# Patient Record
Sex: Female | Born: 1950 | Race: Black or African American | Hispanic: No | Marital: Single | Smoking: Never smoker
Health system: Southern US, Community
[De-identification: ages and names within clinical notes are randomized; demographics above are authoritative.]

## PROBLEM LIST (undated history)

## (undated) DIAGNOSIS — T8859XA Other complications of anesthesia, initial encounter: Secondary | ICD-10-CM

## (undated) DIAGNOSIS — M1990 Unspecified osteoarthritis, unspecified site: Secondary | ICD-10-CM

## (undated) DIAGNOSIS — E785 Hyperlipidemia, unspecified: Secondary | ICD-10-CM

## (undated) DIAGNOSIS — E669 Obesity, unspecified: Secondary | ICD-10-CM

## (undated) DIAGNOSIS — Z8742 Personal history of other diseases of the female genital tract: Secondary | ICD-10-CM

## (undated) DIAGNOSIS — J189 Pneumonia, unspecified organism: Secondary | ICD-10-CM

## (undated) DIAGNOSIS — K279 Peptic ulcer, site unspecified, unspecified as acute or chronic, without hemorrhage or perforation: Secondary | ICD-10-CM

## (undated) DIAGNOSIS — I10 Essential (primary) hypertension: Secondary | ICD-10-CM

## (undated) DIAGNOSIS — Z9889 Other specified postprocedural states: Secondary | ICD-10-CM

## (undated) HISTORY — PX: TONSILLECTOMY: SUR1361

## (undated) HISTORY — PX: NECK SURGERY: SHX720

## (undated) HISTORY — DX: Personal history of other diseases of the female genital tract: Z87.42

## (undated) HISTORY — DX: Peptic ulcer, site unspecified, unspecified as acute or chronic, without hemorrhage or perforation: K27.9

## (undated) HISTORY — DX: Essential (primary) hypertension: I10

## (undated) HISTORY — PX: TOOTH EXTRACTION: SUR596

## (undated) HISTORY — PX: OTHER SURGICAL HISTORY: SHX169

## (undated) HISTORY — PX: ABDOMINAL HYSTERECTOMY: SHX81

## (undated) HISTORY — PX: KNEE ARTHROSCOPY: SUR90

## (undated) HISTORY — DX: Obesity, unspecified: E66.9

## (undated) HISTORY — PX: TUBAL LIGATION: SHX77

## (undated) HISTORY — PX: OOPHORECTOMY: SHX86

## (undated) HISTORY — DX: Hyperlipidemia, unspecified: E78.5

## (undated) HISTORY — PX: CARPAL TUNNEL RELEASE: SHX101

---

## 2010-09-13 ENCOUNTER — Emergency Department (HOSPITAL_BASED_OUTPATIENT_CLINIC_OR_DEPARTMENT_OTHER): Admission: EM | Admit: 2010-09-13 | Discharge: 2010-09-13 | Payer: Self-pay | Admitting: Emergency Medicine

## 2010-09-26 ENCOUNTER — Ambulatory Visit: Payer: Self-pay | Admitting: Diagnostic Radiology

## 2010-09-26 ENCOUNTER — Emergency Department (HOSPITAL_BASED_OUTPATIENT_CLINIC_OR_DEPARTMENT_OTHER): Admission: EM | Admit: 2010-09-26 | Discharge: 2010-09-26 | Payer: Self-pay | Admitting: Emergency Medicine

## 2010-11-13 ENCOUNTER — Emergency Department (HOSPITAL_BASED_OUTPATIENT_CLINIC_OR_DEPARTMENT_OTHER)
Admission: EM | Admit: 2010-11-13 | Discharge: 2010-11-13 | Disposition: A | Discharge status: Other Acute Inpatient Hospital | Payer: Self-pay | Source: Home / Self Care | Admitting: Emergency Medicine

## 2010-11-14 LAB — POCT CARDIAC MARKERS
CKMB, poc: 1.2 ng/mL (ref 1.0–8.0)
CKMB, poc: <1 ng/mL — ABNORMAL LOW (ref 1.0–8.0)
Myoglobin, poc: 88.5 ng/mL (ref 12–200)
Myoglobin, poc: 72.4 ng/mL (ref 12–200)
Troponin i, poc: <0.05 ng/mL (ref 0.00–0.09)
Troponin i, poc: <0.05 ng/mL (ref 0.00–0.09)

## 2010-11-14 LAB — BASIC METABOLIC PANEL
BUN: 18 mg/dL (ref 6–23)
CO2: 27 mEq/L (ref 19–32)
Calcium: 9.8 mg/dL (ref 8.4–10.5)
Chloride: 104 mEq/L (ref 96–112)
Creatinine, Ser: 1.2 mg/dL (ref 0.4–1.2)
GFR calc Af Amer: 55 mL/min — ABNORMAL LOW (ref >60)
GFR calc non Af Amer: 46 mL/min — ABNORMAL LOW (ref >60)
Glucose, Bld: 103 mg/dL — ABNORMAL HIGH (ref 70–99)
Potassium: 3.2 mEq/L — ABNORMAL LOW (ref 3.5–5.1)
Sodium: 144 mEq/L (ref 135–145)

## 2010-11-14 LAB — DIFFERENTIAL
Basophils Absolute: 0.1 10*3/uL (ref 0.0–0.1)
Basophils Relative: 1 % (ref 0–1)
Eosinophils Absolute: 0.1 10*3/uL (ref 0.0–0.7)
Eosinophils Relative: 2 % (ref 0–5)
Lymphocytes Relative: 36 % (ref 12–46)
Lymphs Abs: 2.3 10*3/uL (ref 0.7–4.0)
Monocytes Absolute: 0.6 10*3/uL (ref 0.1–1.0)
Monocytes Relative: 9 % (ref 3–12)
Neutro Abs: 3.4 10*3/uL (ref 1.7–7.7)
Neutrophils Relative %: 52 % (ref 43–77)

## 2010-11-14 LAB — CBC
HCT: 41.2 % (ref 36.0–46.0)
Hemoglobin: 13.8 g/dL (ref 12.0–15.0)
MCH: 28.2 pg (ref 26.0–34.0)
MCHC: 33.5 g/dL (ref 30.0–36.0)
MCV: 84.1 fL (ref 78.0–100.0)
Platelets: 256 10*3/uL (ref 150–400)
RBC: 4.9 MIL/uL (ref 3.87–5.11)
RDW: 13.4 % (ref 11.5–15.5)
WBC: 6.5 10*3/uL (ref 4.0–10.5)

## 2011-01-10 LAB — POCT CARDIAC MARKERS
CKMB, poc: <1 ng/mL — ABNORMAL LOW (ref 1.0–8.0)
Myoglobin, poc: 57.5 ng/mL (ref 12–200)
Troponin i, poc: <0.05 ng/mL (ref 0.00–0.09)

## 2011-01-10 LAB — COMPREHENSIVE METABOLIC PANEL
ALT: 24 U/L (ref 0–35)
AST: 30 U/L (ref 0–37)
Albumin: 4.4 g/dL (ref 3.5–5.2)
Alkaline Phosphatase: 133 U/L — ABNORMAL HIGH (ref 39–117)
BUN: 26 mg/dL — ABNORMAL HIGH (ref 6–23)
CO2: 29 mEq/L (ref 19–32)
Calcium: 9.5 mg/dL (ref 8.4–10.5)
Chloride: 102 mEq/L (ref 96–112)
Creatinine, Ser: 1.1 mg/dL (ref 0.4–1.2)
GFR calc Af Amer: >60 mL/min (ref >60)
GFR calc non Af Amer: 51 mL/min — ABNORMAL LOW (ref >60)
Glucose, Bld: 123 mg/dL — ABNORMAL HIGH (ref 70–99)
Potassium: 3.2 mEq/L — ABNORMAL LOW (ref 3.5–5.1)
Sodium: 144 mEq/L (ref 135–145)
Total Bilirubin: 0.5 mg/dL (ref 0.3–1.2)
Total Protein: 8.2 g/dL (ref 6.0–8.3)

## 2011-01-10 LAB — DIFFERENTIAL
Basophils Absolute: 0.1 10*3/uL (ref 0.0–0.1)
Basophils Relative: 2 % — ABNORMAL HIGH (ref 0–1)
Eosinophils Absolute: 0.3 10*3/uL (ref 0.0–0.7)
Eosinophils Relative: 4 % (ref 0–5)
Lymphocytes Relative: 43 % (ref 12–46)
Lymphs Abs: 3.5 10*3/uL (ref 0.7–4.0)
Monocytes Absolute: 0.6 10*3/uL (ref 0.1–1.0)
Monocytes Relative: 8 % (ref 3–12)
Neutro Abs: 3.6 10*3/uL (ref 1.7–7.7)
Neutrophils Relative %: 44 % (ref 43–77)

## 2011-01-10 LAB — CBC
HCT: 40.3 % (ref 36.0–46.0)
HCT: 40.3 % (ref 36.0–46.0)
Hemoglobin: 13.7 g/dL (ref 12.0–15.0)
Hemoglobin: 13.7 g/dL (ref 12.0–15.0)
MCH: 29.2 pg (ref 26.0–34.0)
MCH: 29.2 pg (ref 26.0–34.0)
MCHC: 34 g/dL (ref 30.0–36.0)
MCHC: 34 g/dL (ref 30.0–36.0)
MCV: 86 fL (ref 78.0–100.0)
MCV: 86 fL (ref 78.0–100.0)
Platelets: 238 10*3/uL (ref 150–400)
Platelets: 238 10*3/uL (ref 150–400)
RBC: 4.69 MIL/uL (ref 3.87–5.11)
RBC: 4.69 MIL/uL (ref 3.87–5.11)
RDW: 13 % (ref 11.5–15.5)
RDW: 13 % (ref 11.5–15.5)
WBC: 7.5 10*3/uL (ref 4.0–10.5)
WBC: 7.5 10*3/uL (ref 4.0–10.5)

## 2011-01-25 ENCOUNTER — Encounter: Payer: Self-pay | Admitting: Family Medicine

## 2011-01-25 ENCOUNTER — Ambulatory Visit (INDEPENDENT_AMBULATORY_CARE_PROVIDER_SITE_OTHER): Payer: Medicare Other | Admitting: Family Medicine

## 2011-01-25 DIAGNOSIS — M542 Cervicalgia: Secondary | ICD-10-CM

## 2011-01-25 DIAGNOSIS — M545 Low back pain, unspecified: Secondary | ICD-10-CM

## 2011-01-25 MED ORDER — TRAMADOL HCL 50 MG PO TABS: 50.0000 mg | ORAL_TABLET | Freq: Four times a day (QID) | ORAL | Status: AC | PRN

## 2011-01-25 NOTE — Patient Instructions (Addendum)
You have lumbar radiculopathy (a pinched nerve in your low back) and neck strain/arthritis. Start physical therapy for your neck for the next 4-6 weeks and do the home exercises/stretches they show you. Your neck MRI doesn't show a pinched nerve or herniated disc to account for your pain so this is just arthritis and musculoskeletal pain and physical therapy should help. Take tylenol for baseline pain relief (1-2 extra strength tabs 3x/day). Take tramadol 50mg  1 tablet up to four times a day for pain.  Start at bedtime first in case it makes you sleepy.  Don't drive while on this if it does make you sleepy. Stay as active as possible. Strengthening of low back muscles, abdominal musculature are key for long term pain relief - do the few stretches I showed you today once a day - hold each stretch for 20-30 seconds each and do them once a day. We will refer you to the doctor who does injections in the low back to see if you are a good candidate for them.  As I mentioned, with severe disease the shots are less likely to be helpful though he may try them first before considering surgery referral. There are other medications (lyrica, neurontin, nortriptyline) that can be tried specifically for pain as well. Follow up with me in 5-6 weeks for a recheck on how you're doing.

## 2011-01-26 ENCOUNTER — Encounter: Payer: Self-pay | Admitting: Family Medicine

## 2011-01-26 DIAGNOSIS — M545 Low back pain, unspecified: Secondary | ICD-10-CM | POA: Insufficient documentation

## 2011-01-26 DIAGNOSIS — M542 Cervicalgia: Secondary | ICD-10-CM | POA: Insufficient documentation

## 2011-01-26 NOTE — Assessment & Plan Note (Signed)
MRI findings and exam not consistent with radiculopathy or discogenic cause of her neck pain.  Start physical therapy, regular tylenol with tramadol as needed severe pain.  Heat 15 minutes at a time 3-4 times a day.  Consider massage.  She has already tried chiropractic care and acupuncture.  NSAIDs contraindicated with ulcer disease.
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
## 2011-01-26 NOTE — Assessment & Plan Note (Signed)
MRI shows severe multilevel disease with foraminal narrowing.  MRI findings more severe than her exam would indicate.  No evidence of radiculopathy on exam though her pain certainly may be caused by severe DDD, nerve irritation and compression at one of these levels.  No loss of reflexes, good strength, and sensation is not in a dermatomal pattern (both sides - more stocking and glove, does have DM, no red flags).  Discussed options at this point.  She wants to exhaust all of them before considering surgical consultation.  Has tried PT (multiple sessions), chiropractor, acupuncture, pain medication, muscle relaxants without much benefit.  She would consider trying ESIs - will refer her for this.  We discussed that given the severity of disease in low back, these are more likely to be unsuccessful than if she had only mild or moderate disease.

## 2011-02-08 ENCOUNTER — Ambulatory Visit: Payer: Medicare Other | Attending: Family Medicine | Admitting: Physical Therapy

## 2011-02-08 DIAGNOSIS — M542 Cervicalgia: Secondary | ICD-10-CM | POA: Insufficient documentation

## 2011-02-08 DIAGNOSIS — M2560 Stiffness of unspecified joint, not elsewhere classified: Secondary | ICD-10-CM | POA: Insufficient documentation

## 2011-02-08 DIAGNOSIS — IMO0001 Reserved for inherently not codable concepts without codable children: Secondary | ICD-10-CM | POA: Insufficient documentation

## 2011-02-13 ENCOUNTER — Ambulatory Visit: Payer: Medicare Other | Admitting: Rehabilitation

## 2011-02-15 ENCOUNTER — Ambulatory Visit: Payer: Medicare Other | Admitting: Physical Therapy

## 2011-02-22 ENCOUNTER — Ambulatory Visit: Payer: Medicare Other | Admitting: Physical Therapy

## 2011-02-28 ENCOUNTER — Encounter: Payer: Medicare Other | Admitting: Physical Therapy

## 2011-03-02 ENCOUNTER — Encounter: Payer: Medicare Other | Admitting: Physical Therapy

## 2011-03-09 ENCOUNTER — Ambulatory Visit: Payer: Medicare Other | Attending: Family Medicine | Admitting: Rehabilitation

## 2011-03-09 DIAGNOSIS — IMO0001 Reserved for inherently not codable concepts without codable children: Secondary | ICD-10-CM | POA: Insufficient documentation

## 2011-03-09 DIAGNOSIS — M2560 Stiffness of unspecified joint, not elsewhere classified: Secondary | ICD-10-CM | POA: Insufficient documentation

## 2011-03-09 DIAGNOSIS — M542 Cervicalgia: Secondary | ICD-10-CM | POA: Insufficient documentation

## 2011-03-16 ENCOUNTER — Ambulatory Visit: Payer: Medicare Other | Admitting: Rehabilitation

## 2011-03-22 ENCOUNTER — Encounter: Payer: Medicare Other | Admitting: Rehabilitation

## 2011-04-05 ENCOUNTER — Other Ambulatory Visit: Payer: Self-pay | Admitting: Radiology

## 2011-04-19 ENCOUNTER — Other Ambulatory Visit: Payer: Self-pay | Admitting: Specialist

## 2011-05-04 ENCOUNTER — Ambulatory Visit (INDEPENDENT_AMBULATORY_CARE_PROVIDER_SITE_OTHER): Payer: Self-pay | Admitting: General Surgery

## 2013-10-24 ENCOUNTER — Ambulatory Visit
Admission: RE | Admit: 2013-10-24 | Discharge: 2013-10-24 | Disposition: A | Discharge status: Home / Self Care | Payer: Medicare Other | Source: Ambulatory Visit | Attending: Family Medicine | Admitting: Family Medicine

## 2013-10-24 ENCOUNTER — Other Ambulatory Visit: Payer: Self-pay | Admitting: Family Medicine

## 2013-10-24 DIAGNOSIS — M25561 Pain in right knee: Secondary | ICD-10-CM

## 2014-01-14 ENCOUNTER — Encounter: Payer: Self-pay | Admitting: Neurology

## 2014-01-15 ENCOUNTER — Encounter (INDEPENDENT_AMBULATORY_CARE_PROVIDER_SITE_OTHER): Payer: Self-pay

## 2014-01-15 ENCOUNTER — Ambulatory Visit (INDEPENDENT_AMBULATORY_CARE_PROVIDER_SITE_OTHER): Payer: Medicare Other | Admitting: Neurology

## 2014-01-15 ENCOUNTER — Encounter: Payer: Self-pay | Admitting: Neurology

## 2014-01-15 VITALS — BP 142/79 | HR 99 | Ht 61.0 in | Wt 240.0 lb

## 2014-01-15 DIAGNOSIS — R519 Headache, unspecified: Secondary | ICD-10-CM | POA: Insufficient documentation

## 2014-01-15 DIAGNOSIS — R51 Headache: Secondary | ICD-10-CM | POA: Insufficient documentation

## 2014-01-15 DIAGNOSIS — R42 Dizziness and giddiness: Secondary | ICD-10-CM

## 2014-01-15 MED ORDER — TOPIRAMATE 25 MG PO TABS: ORAL_TABLET | ORAL | Status: DC

## 2014-01-15 NOTE — Patient Instructions (Addendum)
  Topamax (topiramate) is a seizure medication that has an FDA approval for seizures and for migraine headache. Potential side effects of this medication include weight loss, cognitive slowing, tingling in the fingers and toes, and carbonated drinks will taste bad. If any significant side effects are noted on this drug, please contact our office.    Dizziness Dizziness is a common problem. It is a feeling of unsteadiness or lightheadedness. You may feel like you are about to faint. Dizziness can lead to injury if you stumble or fall. A person of any age group can suffer from dizziness, but dizziness is more common in older adults. CAUSES  Dizziness can be caused by many different things, including:  Middle ear problems.  Standing for too long.  Infections.  An allergic reaction.  Aging.  An emotional response to something, such as the sight of blood.  Side effects of medicines.  Fatigue.  Problems with circulation or blood pressure.  Excess use of alcohol, medicines, or illegal drug use.  Breathing too fast (hyperventilation).  An arrhythmia or problems with your heart rhythm.  Low red blood cell count (anemia).  Pregnancy.  Vomiting, diarrhea, fever, or other illnesses that cause dehydration.  Diseases or conditions such as Parkinson's disease, high blood pressure (hypertension), diabetes, and thyroid problems.  Exposure to extreme heat. DIAGNOSIS  To find the cause of your dizziness, your caregiver may do a physical exam, lab tests, radiologic imaging scans, or an electrocardiography test (ECG).  TREATMENT  Treatment of dizziness depends on the cause of your symptoms and can vary greatly. HOME CARE INSTRUCTIONS   Drink enough fluids to keep your urine clear or pale yellow. This is especially important in very hot weather. In the elderly, it is also important in cold weather.  If your dizziness is caused by medicines, take them exactly as directed. When taking  blood pressure medicines, it is especially important to get up slowly.  Rise slowly from chairs and steady yourself until you feel okay.  In the morning, first sit up on the side of the bed. When this seems okay, stand slowly while holding onto something until you know your balance is fine.  If you need to stand in one place for a long time, be sure to move your legs often. Tighten and relax the muscles in your legs while standing.  If dizziness continues to be a problem, have someone stay with you for a day or two. Do this until you feel you are well enough to stay alone. Have the person call your caregiver if he or she notices changes in you that are concerning.  Do not drive or use heavy machinery if you feel dizzy.  Do not drink alcohol. SEEK IMMEDIATE MEDICAL CARE IF:   Your dizziness or lightheadedness gets worse.  You feel nauseous or vomit.  You develop problems with talking, walking, weakness, or using your arms, hands, or legs.  You are not thinking clearly or you have difficulty forming sentences. It may take a friend or family member to determine if your thinking is normal.  You develop chest pain, abdominal pain, shortness of breath, or sweating.  Your vision changes.  You notice any bleeding.  You have side effects from medicine that seems to be getting worse rather than better. MAKE SURE YOU:   Understand these instructions.  Will watch your condition.  Will get help right away if you are not doing well or get worse. Document Released: 04/11/2001 Document Revised: 01/08/2012   Document Reviewed: 05/05/2011 [employer] Patient Information 2014 [employer].  

## 2014-01-15 NOTE — Progress Notes (Signed)
Reason for visit: Headache, dizziness  Shirley Orozco is a 63 y.o. female  History of present illness:  [name] is a 63 year old right-handed black female with a history of a fall that occurred on 04/11/2013. The patient was in Pilates at the time, and she fell backwards and struck the right occipital area of the head. The patient did not lose consciousness. Following that fall, the patient began having some tenderness in the back of the head on the right, and she also had episodes of vertigo and headache. The patient indicates that the vertigo is occurring relatively infrequently, on average 2 times a month at this point. The episodes may last 3 or 4 hours, and after the dizziness resolves, the patient generally will get a headache. The headache may be in the right occipital area or in the frontal regions. The patient indicates that she may have some nausea with the headache, and she does not vomit. The patient reports no visual changes, or numbness or weakness of the extremities or face with the headaches or dizziness. The patient may have headaches unassociated with dizziness as well occurring once or twice a week. The patient notes that she has a pressure sensation in the head with the headache, and a sharp pain. The patient may have photophobia and phonophobia. The patient indicates that she used to have headaches when she was in her 30s, but these headaches resolved until just recently. The patient believes that the headaches have increased in frequency over time. The patient does not take any medications for the headache. The patient denies any particular aggravating factors for the headache. The patient has undergone MRI evaluation of the brain that shows bilateral periventricular matter changes that are chronic in nature, and evidence of a left maxillary sinusitis. The MRI of the brain was done on 05/12/2013. The patient is sent to this office for further evaluation. The patient in the past  was seen by Dr. Gean Quint from ENT.  Past Medical History  Diagnosis Date  . Hypertension   . Hyperlipidemia   . Diabetes mellitus   . PUD (peptic ulcer disease)   . Personal history of other genital system and obstetric disorders(V13.29)   . Obesity     Past Surgical History  Procedure Laterality Date  . Abdominal hysterectomy    . Neck surgery    . Oophorectomy    . Tooth extraction    . Tubal ligation    . Wrist surgery      right CTS  . Tonsillectomy      Family History  Problem Relation Age of Onset  . Hypertension Father   . Diabetes Sister   . Hypertension Sister   . Heart attack Brother   . Hypertension Brother   . Diabetes Maternal Aunt   . Heart attack Maternal Aunt   . Hypertension Maternal Aunt   . Heart attack Maternal Grandmother     Social history:  reports that she has never smoked. She has never used smokeless tobacco. She reports that she drinks alcohol. She reports that she does not use illicit drugs.  Medications:  Current Outpatient Prescriptions on File Prior to Visit  Medication Sig Dispense Refill  . Aliskiren-Hydrochlorothiazide 300-25 MG TABS Take 1 tablet by mouth daily.        . metFORMIN (GLUMETZA) 500 MG (MOD) 24 hr tablet Take 500 mg by mouth daily with breakfast.        . simvastatin (ZOCOR) 40 MG tablet Take 40 mg  by mouth at bedtime.         No current facility-administered medications on file prior to visit.      Allergies  Allergen Reactions  . Sulfa Antibiotics   . Klor-Con [Potassium Chloride Er] Rash    Capsule only    ROS:  Out of a complete 14 system review of symptoms, the patient complains only of the following symptoms, and all other reviewed systems are negative.  Hearing loss Cough Headache, dizziness Insomnia, sleepiness, snoring  Blood pressure 142/79, pulse 99, height 5\' 1"  (1.549 m), weight 240 lb (108.863 kg).  Physical Exam  General: The patient is alert and cooperative at the time of the  examination. The patient is markedly obese.  Eyes: Pupils are equal, round, and reactive to light. Discs are flat bilaterally.  Neck: The neck is supple, no carotid bruits are noted.  Respiratory: The respiratory examination is clear.  Cardiovascular: The cardiovascular examination reveals a regular rate and rhythm, no obvious murmurs or rubs are noted.  Skin: Extremities are without significant edema.  Neurologic Exam  Mental status: The patient is alert and oriented x 3 at the time of the examination. The patient has apparent normal recent and remote memory, with an apparently normal attention span and concentration ability.  Cranial nerves: Facial symmetry is present. There is good sensation of the face to pinprick and soft touch bilaterally. The strength of the facial muscles and the muscles to head turning and shoulder shrug are normal bilaterally. Speech is well enunciated, no aphasia or dysarthria is noted. Extraocular movements are full. Visual fields are full. The tongue is midline, and the patient has symmetric elevation of the soft palate. No obvious hearing deficits are noted.  Motor: The motor testing reveals 5 over 5 strength of all 4 extremities. Good symmetric motor tone is noted throughout.  Sensory: Sensory testing is intact to pinprick, soft touch, vibration sensation, and position sense on all 4 extremities, with exception that there is some slight decreased pinprick sensation on the left arm and leg as compared to the right. No evidence of extinction is noted.  Coordination: Cerebellar testing reveals good finger-nose-finger and heel-to-shin bilaterally.  Gait and station: Gait is normal. Tandem gait is slightly unsteady. Romberg is negative. No drift is seen.  Reflexes: Deep tendon reflexes are symmetric, but are depressed bilaterally. Toes are downgoing bilaterally.   Assessment/Plan:  1. History of fall, head trauma  2. Episodic vertigo  3. Episodic  headache  The patient is reporting vertigo that may last several hours, and then resolve with onset of headache. The patient also has headache without dizziness. These episodes could represent trauma triggered migraine, as the patient has a pre-existing history of headache and migraine in her 30s. The patient will be initiated on Topamax, and the dose will be increased over time. The patient will followup through this office in 4 months. The patient will contact me if she has side effects on the medication.  Marlan Palau. Keith Willis MD 01/15/2014 5:18 PM  [facility] [address] [place], [place] [id number]  Phone [phone] Fax [phone]

## 2014-01-26 ENCOUNTER — Telehealth: Payer: Self-pay | Admitting: *Deleted

## 2014-01-26 NOTE — Telephone Encounter (Signed)
Pt calling to inform Dr. Anne HahnWillis that she is going to discontinue the Topiramate due to side effects. Pt states that it causes her to have nausea and a cough. FYI

## 2014-01-26 NOTE — Telephone Encounter (Signed)
I called patient. The patient went off of the Topamax, she could not tolerate it. I offered to start another medication such as propranolol for her migraine, but she wants to wait and see how her headaches do. The patient gets dizziness, vertigo, and he gets a headache later.

## 2014-05-15 ENCOUNTER — Other Ambulatory Visit: Payer: Self-pay | Admitting: Family Medicine

## 2014-05-15 DIAGNOSIS — R109 Unspecified abdominal pain: Secondary | ICD-10-CM

## 2014-05-20 ENCOUNTER — Ambulatory Visit
Admission: RE | Admit: 2014-05-20 | Discharge: 2014-05-20 | Disposition: A | Discharge status: Home / Self Care | Payer: Medicare Other | Source: Ambulatory Visit | Attending: Family Medicine | Admitting: Family Medicine

## 2014-05-20 DIAGNOSIS — R109 Unspecified abdominal pain: Secondary | ICD-10-CM

## 2014-05-20 MED ORDER — IOHEXOL 300 MG/ML  SOLN
125.0000 mL | Freq: Once | INTRAMUSCULAR | Status: AC | PRN
  Administered 2014-05-20: 125 mL via INTRAVENOUS

## 2014-05-21 ENCOUNTER — Ambulatory Visit: Payer: Medicare Other | Admitting: Nurse Practitioner

## 2014-05-22 ENCOUNTER — Other Ambulatory Visit: Payer: Self-pay | Admitting: Family Medicine

## 2014-05-22 DIAGNOSIS — N289 Disorder of kidney and ureter, unspecified: Secondary | ICD-10-CM

## 2014-05-30 ENCOUNTER — Ambulatory Visit
Admission: RE | Admit: 2014-05-30 | Discharge: 2014-05-30 | Disposition: A | Discharge status: Home / Self Care | Payer: Medicare Other | Source: Ambulatory Visit | Attending: Family Medicine | Admitting: Family Medicine

## 2014-05-30 DIAGNOSIS — N289 Disorder of kidney and ureter, unspecified: Secondary | ICD-10-CM

## 2014-05-30 MED ORDER — GADOBENATE DIMEGLUMINE 529 MG/ML IV SOLN
10.0000 mL | Freq: Once | INTRAVENOUS | Status: AC | PRN
  Administered 2014-05-30: 10 mL via INTRAVENOUS

## 2014-06-10 ENCOUNTER — Other Ambulatory Visit: Payer: Self-pay | Admitting: Gastroenterology

## 2014-09-16 ENCOUNTER — Encounter: Payer: Self-pay | Admitting: Neurology

## 2014-09-22 ENCOUNTER — Encounter: Payer: Self-pay | Admitting: Neurology

## 2014-11-17 ENCOUNTER — Encounter: Payer: Self-pay | Admitting: Podiatry

## 2014-11-17 ENCOUNTER — Ambulatory Visit (INDEPENDENT_AMBULATORY_CARE_PROVIDER_SITE_OTHER): Payer: Medicare Other | Admitting: Podiatry

## 2014-11-17 VITALS — BP 114/64 | HR 90 | Ht 61.0 in | Wt 226.0 lb

## 2014-11-17 DIAGNOSIS — M216X9 Other acquired deformities of unspecified foot: Secondary | ICD-10-CM | POA: Insufficient documentation

## 2014-11-17 DIAGNOSIS — M722 Plantar fascial fibromatosis: Secondary | ICD-10-CM | POA: Insufficient documentation

## 2014-11-17 DIAGNOSIS — M216X1 Other acquired deformities of right foot: Secondary | ICD-10-CM

## 2014-11-17 MED ORDER — TRIAMCINOLONE 0.1 % CREAM:EUCERIN CREAM 1:1: 1.0000 "application " | TOPICAL_CREAM | Freq: Three times a day (TID) | CUTANEOUS | Status: AC | PRN

## 2014-11-17 NOTE — Progress Notes (Signed)
Subjective: 64 year old female presents stating that last Friday (11/06/14) morning just could not put any weight on the right foot. Has had no history injury or incident that is related with the foot pain.  On feet about 5-6 hours a day exercising and daily routine.  Retired and not working. Taking Metformin and sugar level is under control. A1c was 6.7.  Objective: Noted of tight Achilles tendon right. Mild forefoot varus right.  Rigid cavus type foot. Akin rash right lateral ankle.   Assessment: Ankle equinus right. Forefoot varus bilateral. Plantar fasciitis right. Drug induced rash right ankle.   Plan: Reviewed stretch exercise for tight Achilles tendon, proper shoe gear, and need for custom orthotics. Rx. Triamcinolone cream for itch rash.

## 2014-11-17 NOTE — Patient Instructions (Signed)
Seen for pain in right foot and ankle and skin rash. May benefit from Custom orthotics. Do stretch exercise for tight achilles tendon on right. Use Rx cream for itch rash. Return for orthotics.

## 2014-11-19 ENCOUNTER — Telehealth: Payer: Self-pay | Admitting: *Deleted

## 2015-05-07 ENCOUNTER — Ambulatory Visit (INDEPENDENT_AMBULATORY_CARE_PROVIDER_SITE_OTHER): Payer: Medicare Other | Admitting: Podiatry

## 2015-05-07 ENCOUNTER — Encounter: Payer: Self-pay | Admitting: Podiatry

## 2015-05-07 VITALS — Ht 61.0 in | Wt 226.0 lb

## 2015-05-07 DIAGNOSIS — M79671 Pain in right foot: Secondary | ICD-10-CM | POA: Diagnosis not present

## 2015-05-07 DIAGNOSIS — L270 Generalized skin eruption due to drugs and medicaments taken internally: Secondary | ICD-10-CM

## 2015-05-07 NOTE — Progress Notes (Signed)
Right ankle itch a lot and sore. Itch feeling is worse with Metformin. Using Triamcinolone since January 2016, helps a little.  Objective: Dry red rash like lesion right lateral ankle area with pain.   Assessment: Drug induced skin rash not responding to Cortisone cream and aggravated by increased Metformin.  Plan:  Reviewed findings and possible options. Patient will see her PCP and check on possible change in her medication.

## 2015-05-07 NOTE — Patient Instructions (Signed)
Seen for rash in right ankle. Cortisone cream has minimum effect. Possible due to drug induced. Need to change medication by PCP. Return as needed.

## 2019-05-26 ENCOUNTER — Other Ambulatory Visit: Payer: Self-pay | Admitting: Family Medicine

## 2019-05-26 DIAGNOSIS — R609 Edema, unspecified: Secondary | ICD-10-CM

## 2019-05-28 ENCOUNTER — Ambulatory Visit
Admission: RE | Admit: 2019-05-28 | Discharge: 2019-05-28 | Disposition: A | Discharge status: Home / Self Care | Payer: Medicare Other | Source: Ambulatory Visit | Attending: Family Medicine | Admitting: Family Medicine

## 2019-05-28 DIAGNOSIS — R609 Edema, unspecified: Secondary | ICD-10-CM

## 2019-11-25 ENCOUNTER — Ambulatory Visit: Payer: Medicare Other | Attending: Internal Medicine

## 2019-11-25 DIAGNOSIS — Z20822 Contact with and (suspected) exposure to covid-19: Secondary | ICD-10-CM

## 2019-11-26 LAB — NOVEL CORONAVIRUS, NAA: SARS-CoV-2, NAA: NOT DETECTED

## 2019-11-27 ENCOUNTER — Telehealth: Payer: Self-pay | Admitting: Family Medicine

## 2019-11-27 NOTE — Telephone Encounter (Signed)
Pt called to get COVID results, made her aware they are negative. °

## 2020-03-10 ENCOUNTER — Ambulatory Visit: Payer: Medicare Other | Admitting: Podiatry

## 2021-01-03 DIAGNOSIS — H524 Presbyopia: Secondary | ICD-10-CM | POA: Diagnosis not present

## 2021-01-03 DIAGNOSIS — E119 Type 2 diabetes mellitus without complications: Secondary | ICD-10-CM | POA: Diagnosis not present

## 2021-02-25 DIAGNOSIS — Z1231 Encounter for screening mammogram for malignant neoplasm of breast: Secondary | ICD-10-CM | POA: Diagnosis not present

## 2021-04-19 DIAGNOSIS — M8588 Other specified disorders of bone density and structure, other site: Secondary | ICD-10-CM | POA: Diagnosis not present

## 2021-04-19 DIAGNOSIS — E785 Hyperlipidemia, unspecified: Secondary | ICD-10-CM | POA: Diagnosis not present

## 2021-04-19 DIAGNOSIS — Z1211 Encounter for screening for malignant neoplasm of colon: Secondary | ICD-10-CM | POA: Diagnosis not present

## 2021-04-19 DIAGNOSIS — E1169 Type 2 diabetes mellitus with other specified complication: Secondary | ICD-10-CM | POA: Diagnosis not present

## 2021-04-19 DIAGNOSIS — Z7984 Long term (current) use of oral hypoglycemic drugs: Secondary | ICD-10-CM | POA: Diagnosis not present

## 2021-04-19 DIAGNOSIS — Z0000 Encounter for general adult medical examination without abnormal findings: Secondary | ICD-10-CM | POA: Diagnosis not present

## 2021-04-19 DIAGNOSIS — I10 Essential (primary) hypertension: Secondary | ICD-10-CM | POA: Diagnosis not present

## 2021-04-19 DIAGNOSIS — E041 Nontoxic single thyroid nodule: Secondary | ICD-10-CM | POA: Diagnosis not present

## 2021-04-19 DIAGNOSIS — M8488 Other disorders of continuity of bone, other site: Secondary | ICD-10-CM | POA: Diagnosis not present

## 2021-04-25 DIAGNOSIS — Z1211 Encounter for screening for malignant neoplasm of colon: Secondary | ICD-10-CM | POA: Diagnosis not present

## 2021-05-05 ENCOUNTER — Other Ambulatory Visit: Payer: Self-pay | Admitting: Family Medicine

## 2021-05-05 DIAGNOSIS — E041 Nontoxic single thyroid nodule: Secondary | ICD-10-CM

## 2021-05-18 ENCOUNTER — Other Ambulatory Visit: Payer: Medicare Other

## 2021-05-25 ENCOUNTER — Other Ambulatory Visit: Payer: Medicare Other

## 2021-06-08 ENCOUNTER — Other Ambulatory Visit: Payer: Self-pay

## 2021-06-08 ENCOUNTER — Ambulatory Visit
Admission: RE | Admit: 2021-06-08 | Discharge: 2021-06-08 | Disposition: A | Discharge status: Home / Self Care | Payer: Medicare Other | Source: Ambulatory Visit | Attending: Family Medicine | Admitting: Family Medicine

## 2021-06-08 DIAGNOSIS — E041 Nontoxic single thyroid nodule: Secondary | ICD-10-CM

## 2021-07-13 DIAGNOSIS — L710 Perioral dermatitis: Secondary | ICD-10-CM | POA: Diagnosis not present

## 2021-08-24 DIAGNOSIS — Z780 Asymptomatic menopausal state: Secondary | ICD-10-CM | POA: Diagnosis not present

## 2021-09-14 DIAGNOSIS — K130 Diseases of lips: Secondary | ICD-10-CM | POA: Diagnosis not present

## 2021-10-06 DIAGNOSIS — M25562 Pain in left knee: Secondary | ICD-10-CM | POA: Diagnosis not present

## 2021-10-06 DIAGNOSIS — M25561 Pain in right knee: Secondary | ICD-10-CM | POA: Diagnosis not present

## 2021-10-19 DIAGNOSIS — E1169 Type 2 diabetes mellitus with other specified complication: Secondary | ICD-10-CM | POA: Diagnosis not present

## 2021-10-19 DIAGNOSIS — I10 Essential (primary) hypertension: Secondary | ICD-10-CM | POA: Diagnosis not present

## 2021-10-19 DIAGNOSIS — Z7984 Long term (current) use of oral hypoglycemic drugs: Secondary | ICD-10-CM | POA: Diagnosis not present

## 2021-10-19 DIAGNOSIS — E785 Hyperlipidemia, unspecified: Secondary | ICD-10-CM | POA: Diagnosis not present

## 2021-11-16 DIAGNOSIS — H524 Presbyopia: Secondary | ICD-10-CM | POA: Diagnosis not present

## 2021-11-16 DIAGNOSIS — H40013 Open angle with borderline findings, low risk, bilateral: Secondary | ICD-10-CM | POA: Diagnosis not present

## 2021-11-16 DIAGNOSIS — E119 Type 2 diabetes mellitus without complications: Secondary | ICD-10-CM | POA: Diagnosis not present

## 2022-01-18 ENCOUNTER — Ambulatory Visit: Payer: Medicare Other | Admitting: Podiatry

## 2022-01-18 ENCOUNTER — Other Ambulatory Visit: Payer: Self-pay

## 2022-01-18 ENCOUNTER — Ambulatory Visit (INDEPENDENT_AMBULATORY_CARE_PROVIDER_SITE_OTHER): Payer: Medicare Other

## 2022-01-18 DIAGNOSIS — M722 Plantar fascial fibromatosis: Secondary | ICD-10-CM

## 2022-01-18 MED ORDER — MELOXICAM 15 MG PO TABS: 15.0000 mg | ORAL_TABLET | Freq: Every day | ORAL | 1 refills | Status: DC

## 2022-01-18 MED ORDER — BETAMETHASONE SOD PHOS & ACET 6 (3-3) MG/ML IJ SUSP: 3.0000 mg | Freq: Once | INTRAMUSCULAR | Status: AC

## 2022-01-18 MED ORDER — METHYLPREDNISOLONE 4 MG PO TBPK: ORAL_TABLET | ORAL | 0 refills | Status: AC

## 2022-01-18 NOTE — Progress Notes (Signed)
? ?  Subjective: ?71 y.o. female presenting for evaluation of left heel pain this been going on for about 3 weeks now.  It started when she was exercising at the gym.  She says that over the following week she began to notice increased pain to the left heel.  She has not done anything for treatment.  She presents for further treatment and evaluation ? ? ?Past Medical History:  ?Diagnosis Date  ? Diabetes mellitus   ? Hyperlipidemia   ? Hypertension   ? Obesity   ? Personal history of other genital system and obstetric disorders(V13.29)   ? PUD (peptic ulcer disease)   ? ? ? ?Objective: ?Physical Exam ?General: The patient is alert and oriented x3 in no acute distress. ? ?Dermatology: Skin is warm, dry and supple bilateral lower extremities. Negative for open lesions or macerations bilateral.  ? ?Vascular: Dorsalis Pedis and Posterior Tibial pulses palpable bilateral.  Capillary fill time is immediate to all digits. ? ?Neurological: Epicritic and protective threshold intact bilateral.  ? ?Musculoskeletal: Tenderness to palpation to the plantar aspect of the left heel along the plantar fascia. All other joints range of motion within normal limits bilateral. Strength 5/5 in all groups bilateral.  ? ?Radiographic exam: Normal osseous mineralization. Joint spaces preserved.  Plantar heel spurring noted along the plantar fascia on lateral view.  No other soft tissue abnormalities or radiopaque foreign bodies.  ? ?Assessment: ?1. Plantar fasciitis left foot ?2.  Plantar heel spurs left ? ?Plan of Care:  ?1. Patient evaluated. Xrays reviewed.   ?2. Injection of 0.5cc Celestone soluspan injected into the left plantar fascia.  ?3. Rx for Medrol Dose Pak placed ?4. Rx for Meloxicam ordered for patient. ?5.  Cam boot dispensed.  Weightbearing as tolerated x4 weeks ?6. Instructed patient regarding therapies and modalities at home to alleviate symptoms.  ?7. Return to clinic in 4 weeks.   ? ? ?Felecia Shelling, DPM ?[facility] ? ?Dr. Felecia Shelling, DPM  ?  ?[address]                                     ?[place], [place] [address]                ?Office [phone]  ?Fax [phone] ? ? ? ? ?

## 2022-02-15 ENCOUNTER — Ambulatory Visit: Payer: Medicare Other | Admitting: Podiatry

## 2022-02-15 DIAGNOSIS — M722 Plantar fascial fibromatosis: Secondary | ICD-10-CM

## 2022-02-15 MED ORDER — BETAMETHASONE SOD PHOS & ACET 6 (3-3) MG/ML IJ SUSP
3.0000 mg | Freq: Once | INTRAMUSCULAR | Status: AC
  Administered 2022-02-15: 3 mg via INTRA_ARTICULAR

## 2022-02-15 NOTE — Progress Notes (Signed)
.  bmeplantar ? ?Subjective: ?71 y.o. female presenting for follow-up evaluation of plantar fasciitis to the left foot.  Patient states that she is significantly better.  She did wear the cam boot for about 2-3 weeks.  She also completed the Medrol Dosepak.  No new complaints at this time ? ?Past Medical History:  ?Diagnosis Date  ? Diabetes mellitus   ? Hyperlipidemia   ? Hypertension   ? Obesity   ? Personal history of other genital system and obstetric disorders(V13.29)   ? PUD (peptic ulcer disease)   ? ? ? ?Objective: ?Physical Exam ?General: The patient is alert and oriented x3 in no acute distress. ? ?Dermatology: Skin is warm, dry and supple bilateral lower extremities. Negative for open lesions or macerations bilateral.  ? ?Vascular: Dorsalis Pedis and Posterior Tibial pulses palpable bilateral.  Capillary fill time is immediate to all digits. ? ?Neurological: Epicritic and protective threshold intact bilateral.  ? ?Musculoskeletal: There continues to be some mild tenderness to palpation to the plantar aspect of the left heel along the plantar fascia. All other joints range of motion within normal limits bilateral. Strength 5/5 in all groups bilateral.  ? ? ?Assessment: ?1. Plantar fasciitis left foot ?2.  Plantar heel spurs left ? ?Plan of Care:  ?1. Patient evaluated. Xrays reviewed.   ?2. Injection of 0.5cc Celestone soluspan injected into the left plantar fascia.  ?3.  Patient declined taking the meloxicam after reading the side effects.  Recommend OTC Advil 600 mg 2 times daily ?4.  Return to clinic 4 weeks ? ? ?Felecia Shelling, DPM ?[facility] ? ?Dr. Felecia Shelling, DPM  ?  ?[address]                                     ?[place], [place] [address]                ?Office [phone]  ?Fax [phone] ? ? ? ? ?

## 2022-02-18 IMAGING — US US THYROID
1 series · 14 of 25 positions shown · non-contrast
Comparison: None.

CLINICAL DATA: Thyroid nodule

EXAM:
THYROID ULTRASOUND
TECHNIQUE: Ultrasound examination of the thyroid gland and adjacent soft
tissues was performed.

[Series 1: us thyroid · 0.06mm/px · 14 of 30 slices shown]
[im 1/30]
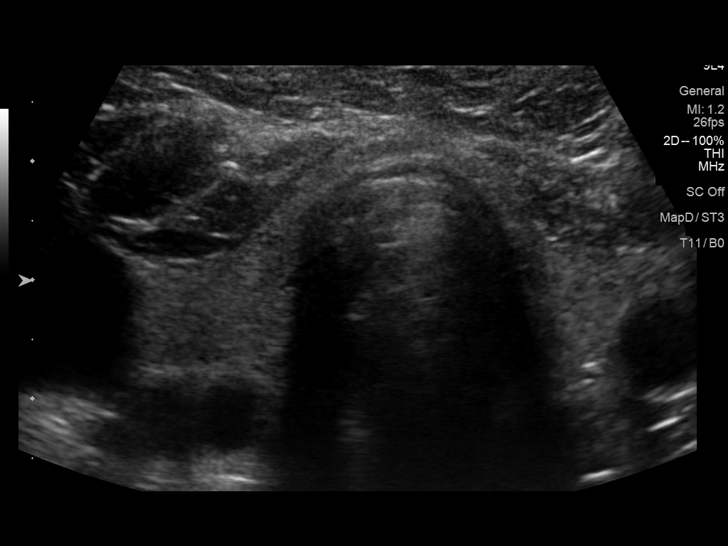
[im 3/30]
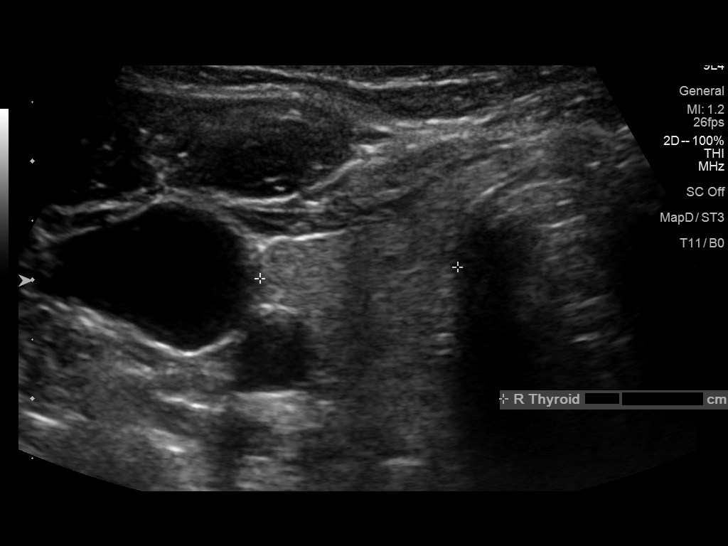
[im 5/30]
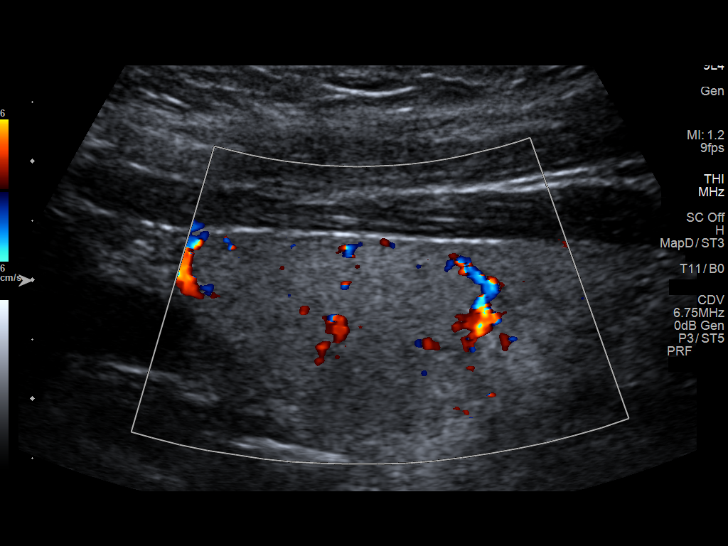
[im 8/30]
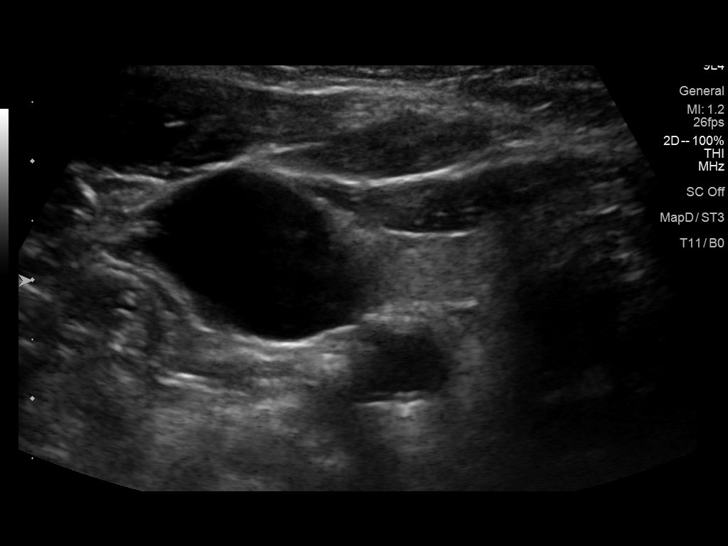
[im 10/30]
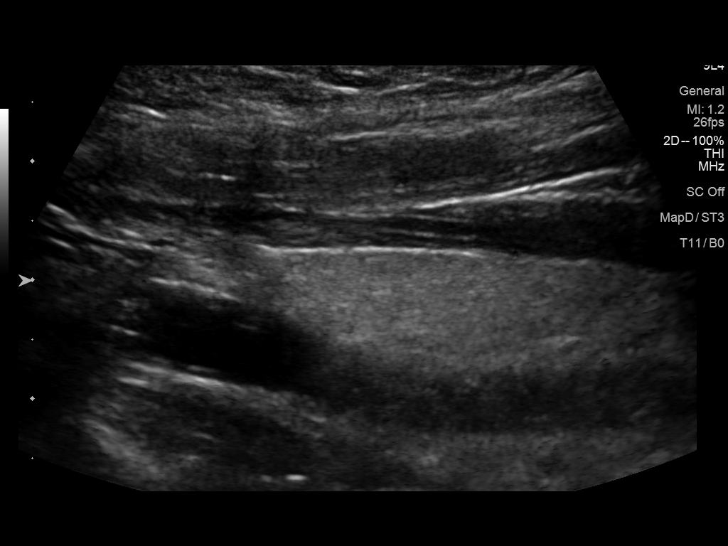
[im 11/30]
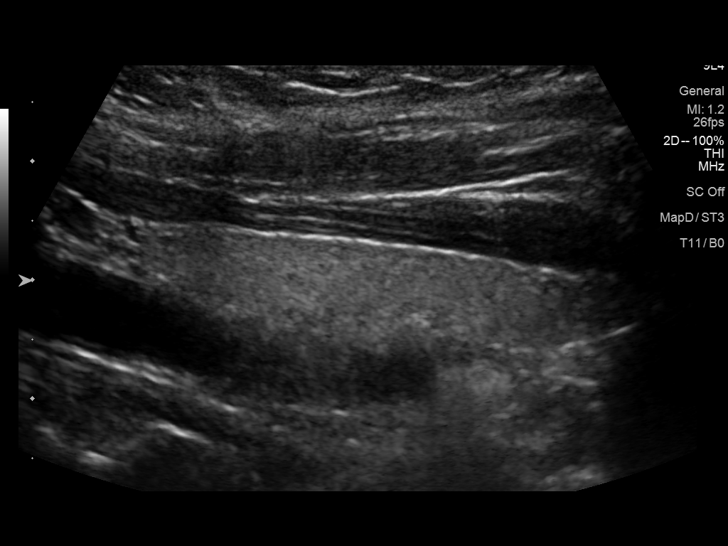
[im 14/30]
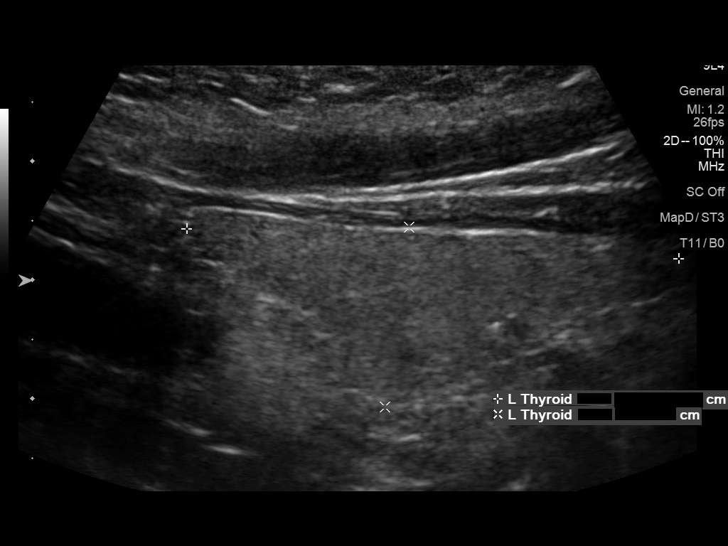
[im 16/30]
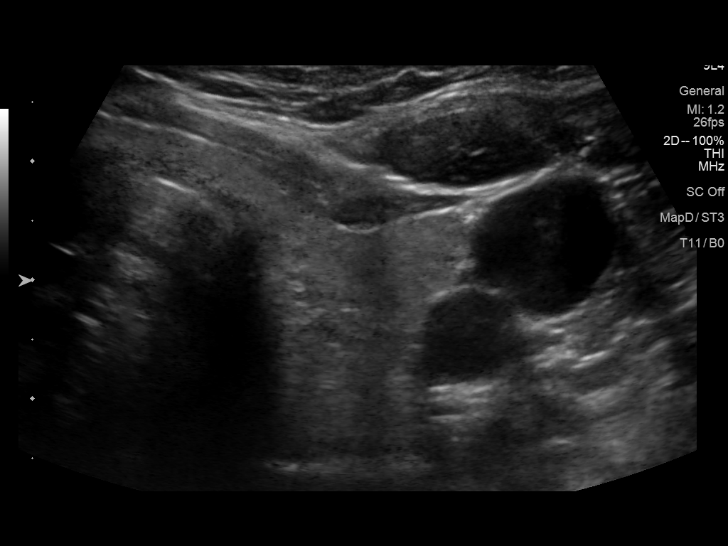
[im 19/30]
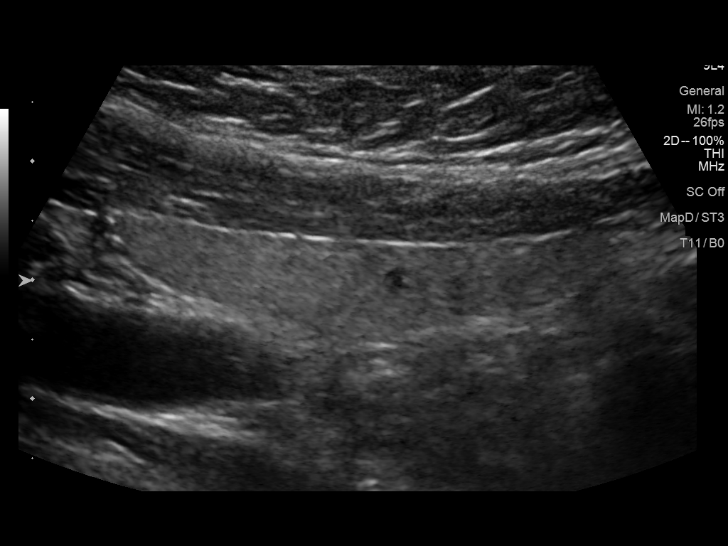
[im 20/30]
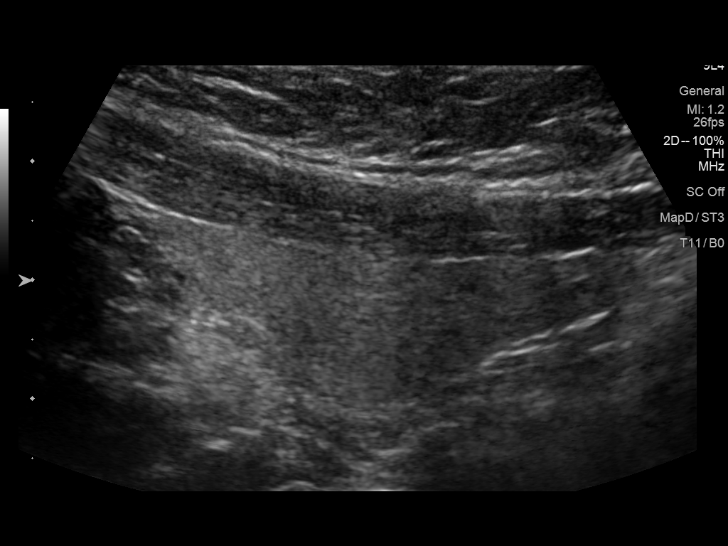
[im 22/30]
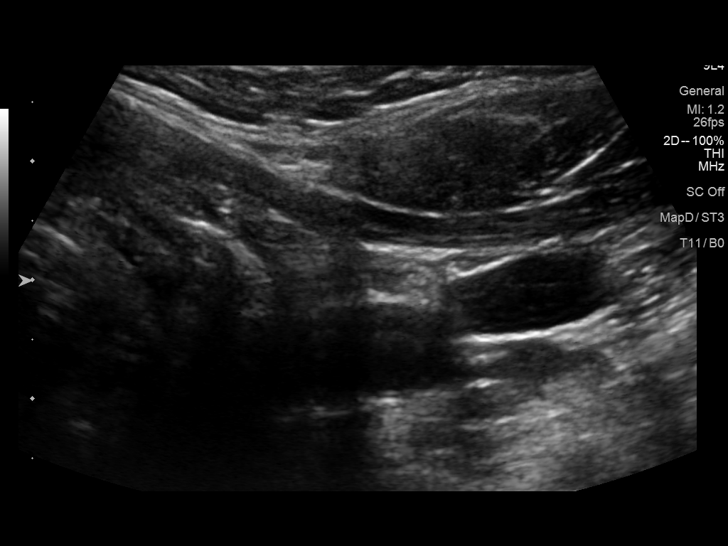
[im 25/30]
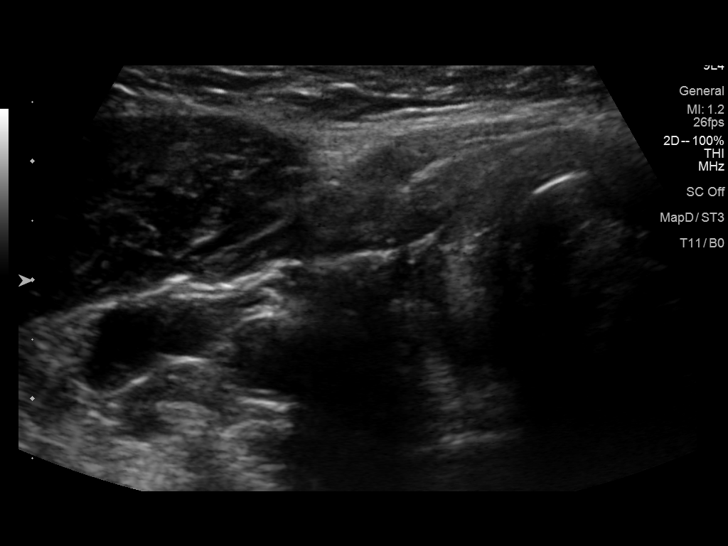
[im 27/30]
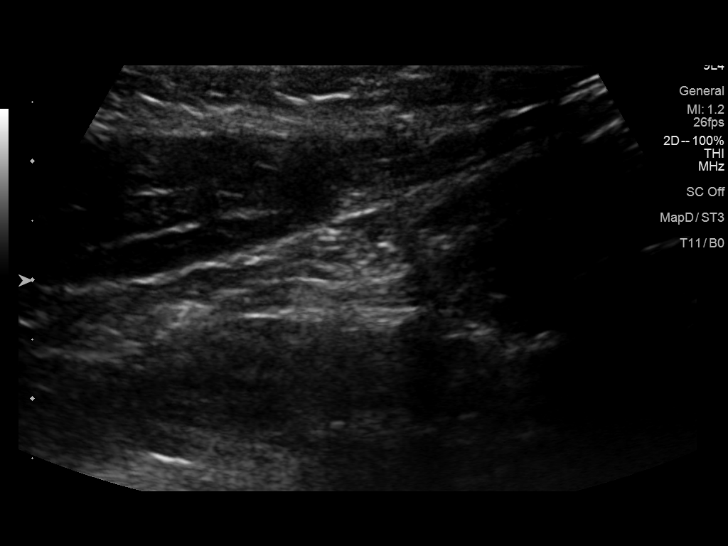
[im 30/30]
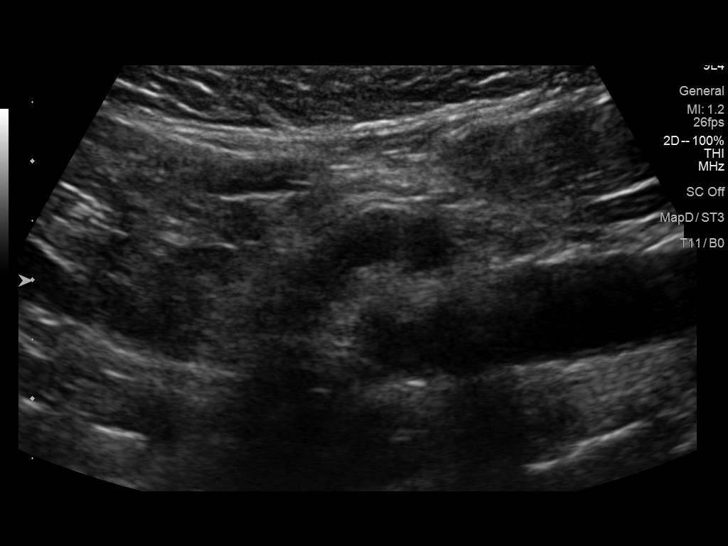

[14 of 25 positions shown; findings below may reference images not displayed]

FINDINGS: Parenchymal Echotexture: Mildly heterogenous

Isthmus: 5 mm

Right lobe: 4.0 x 1.7 x 1.7 cm

Left lobe: 4.2 x 1.5 x 1.3 cm

_________________________________________________________

Estimated total number of nodules >/= 1 cm: 0

Number of spongiform nodules >/=  2 cm not described below (TR1): 0

Number of mixed cystic and solid nodules >/= 1.5 cm not described
below (TR2): 0

_________________________________________________________

Nonspecific minor thyroid heterogeneity. Normal vascularity. No
significant nodule or focal abnormality. No regional adenopathy.
IMPRESSION: Normal thyroid ultrasound for age

The above is in keeping with the ACR TI-RADS recommendations - [HOSPITAL] 1425;[DATE].

## 2022-03-10 DIAGNOSIS — M545 Low back pain, unspecified: Secondary | ICD-10-CM | POA: Diagnosis not present

## 2022-03-22 DIAGNOSIS — M545 Low back pain, unspecified: Secondary | ICD-10-CM | POA: Diagnosis not present

## 2022-03-30 DIAGNOSIS — M545 Low back pain, unspecified: Secondary | ICD-10-CM | POA: Diagnosis not present

## 2022-04-13 DIAGNOSIS — M5416 Radiculopathy, lumbar region: Secondary | ICD-10-CM | POA: Diagnosis not present

## 2022-04-19 ENCOUNTER — Ambulatory Visit (INDEPENDENT_AMBULATORY_CARE_PROVIDER_SITE_OTHER): Payer: Medicare Other | Admitting: Podiatry

## 2022-04-19 DIAGNOSIS — M722 Plantar fascial fibromatosis: Secondary | ICD-10-CM | POA: Diagnosis not present

## 2022-04-19 MED ORDER — BETAMETHASONE SOD PHOS & ACET 6 (3-3) MG/ML IJ SUSP
3.0000 mg | Freq: Once | INTRAMUSCULAR | Status: AC
  Administered 2022-04-19: 3 mg via INTRA_ARTICULAR

## 2022-04-19 MED ORDER — MELOXICAM 15 MG PO TABS: 15.0000 mg | ORAL_TABLET | Freq: Every day | ORAL | 1 refills | Status: AC

## 2022-04-19 NOTE — Progress Notes (Signed)
   Subjective: 71 y.o. female presenting today for follow-up evaluation of left heel pain.  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  She continues to have left heel pain however.  She presents for follow-up treatment and evaluation.  She was last seen here in the office on 02/15/2022   Past Medical History:  Diagnosis Date   Diabetes mellitus    Hyperlipidemia    Hypertension    Obesity    Personal history of other genital system and obstetric disorders(V13.29)    PUD (peptic ulcer disease)    Past Surgical History:  Procedure Laterality Date   ABDOMINAL HYSTERECTOMY     NECK SURGERY     OOPHORECTOMY     TONSILLECTOMY     TOOTH EXTRACTION     TUBAL LIGATION     wrist surgery     right CTS   Allergies  Allergen Reactions   Potassium-Containing Compounds Rash   Sulfa Antibiotics    Topamax [Topiramate]     Nausea   Klor-Con [Potassium Chloride Er] Rash    Capsule only     Objective: Physical Exam General: The patient is alert and oriented x3 in no acute distress.  Dermatology: Skin is warm, dry and supple bilateral lower extremities. Negative for open lesions or macerations bilateral.   Vascular: Dorsalis Pedis and Posterior Tibial pulses palpable bilateral.  Capillary fill time is immediate to all digits.  Neurological: Epicritic and protective threshold intact bilateral.   Musculoskeletal: Tenderness to palpation to the plantar aspect of the left heel along the plantar fascia. All other joints range of motion within normal limits bilateral. Strength 5/5 in all groups bilateral.    Assessment: 1. Plantar fasciitis left foot  Plan of Care:  1. Patient evaluated. Xrays reviewed.   2. Injection of 0.5cc Celestone soluspan injected into the left plantar fascia.  3.  OTC power step insoles dispensed at checkout 4. Rx for Meloxicam ordered for patient.  Patient did not take the last prescription 5. Instructed patient  regarding therapies and modalities at home to alleviate symptoms.  6. Return to clinic in 4 weeks.     Felecia Shelling, DPM [facility]  Dr. Felecia Shelling, DPM    [address] [place], [place] [address]                Office [phone]  Fax [phone]

## 2022-04-28 DIAGNOSIS — M545 Low back pain, unspecified: Secondary | ICD-10-CM | POA: Diagnosis not present

## 2022-05-24 DIAGNOSIS — Z1231 Encounter for screening mammogram for malignant neoplasm of breast: Secondary | ICD-10-CM | POA: Diagnosis not present

## 2022-05-31 ENCOUNTER — Ambulatory Visit: Payer: Medicare Other | Admitting: Podiatry

## 2022-06-29 DIAGNOSIS — M5416 Radiculopathy, lumbar region: Secondary | ICD-10-CM | POA: Diagnosis not present

## 2022-07-25 DIAGNOSIS — M5416 Radiculopathy, lumbar region: Secondary | ICD-10-CM | POA: Diagnosis not present

## 2022-08-07 DIAGNOSIS — E1169 Type 2 diabetes mellitus with other specified complication: Secondary | ICD-10-CM | POA: Diagnosis not present

## 2022-08-07 DIAGNOSIS — Z5181 Encounter for therapeutic drug level monitoring: Secondary | ICD-10-CM | POA: Diagnosis not present

## 2022-08-07 DIAGNOSIS — E785 Hyperlipidemia, unspecified: Secondary | ICD-10-CM | POA: Diagnosis not present

## 2022-08-07 DIAGNOSIS — Z1211 Encounter for screening for malignant neoplasm of colon: Secondary | ICD-10-CM | POA: Diagnosis not present

## 2022-08-07 DIAGNOSIS — I10 Essential (primary) hypertension: Secondary | ICD-10-CM | POA: Diagnosis not present

## 2022-08-07 DIAGNOSIS — M549 Dorsalgia, unspecified: Secondary | ICD-10-CM | POA: Diagnosis not present

## 2022-08-07 DIAGNOSIS — Z0000 Encounter for general adult medical examination without abnormal findings: Secondary | ICD-10-CM | POA: Diagnosis not present

## 2022-08-07 DIAGNOSIS — M8580 Other specified disorders of bone density and structure, unspecified site: Secondary | ICD-10-CM | POA: Diagnosis not present

## 2022-09-20 DIAGNOSIS — Z1211 Encounter for screening for malignant neoplasm of colon: Secondary | ICD-10-CM | POA: Diagnosis not present

## 2022-10-04 DIAGNOSIS — M5416 Radiculopathy, lumbar region: Secondary | ICD-10-CM | POA: Diagnosis not present

## 2022-10-04 DIAGNOSIS — M25552 Pain in left hip: Secondary | ICD-10-CM | POA: Diagnosis not present

## 2022-10-11 DIAGNOSIS — M5416 Radiculopathy, lumbar region: Secondary | ICD-10-CM | POA: Diagnosis not present

## 2022-10-11 DIAGNOSIS — M2569 Stiffness of other specified joint, not elsewhere classified: Secondary | ICD-10-CM | POA: Diagnosis not present

## 2022-10-11 DIAGNOSIS — M6281 Muscle weakness (generalized): Secondary | ICD-10-CM | POA: Diagnosis not present

## 2022-10-11 DIAGNOSIS — M79605 Pain in left leg: Secondary | ICD-10-CM | POA: Diagnosis not present

## 2022-10-11 DIAGNOSIS — M79662 Pain in left lower leg: Secondary | ICD-10-CM | POA: Diagnosis not present

## 2022-10-16 DIAGNOSIS — M79605 Pain in left leg: Secondary | ICD-10-CM | POA: Diagnosis not present

## 2022-10-16 DIAGNOSIS — M79662 Pain in left lower leg: Secondary | ICD-10-CM | POA: Diagnosis not present

## 2022-10-16 DIAGNOSIS — M5416 Radiculopathy, lumbar region: Secondary | ICD-10-CM | POA: Diagnosis not present

## 2022-10-16 DIAGNOSIS — M2569 Stiffness of other specified joint, not elsewhere classified: Secondary | ICD-10-CM | POA: Diagnosis not present

## 2022-10-16 DIAGNOSIS — M6281 Muscle weakness (generalized): Secondary | ICD-10-CM | POA: Diagnosis not present

## 2022-10-18 DIAGNOSIS — M2569 Stiffness of other specified joint, not elsewhere classified: Secondary | ICD-10-CM | POA: Diagnosis not present

## 2022-10-18 DIAGNOSIS — M5416 Radiculopathy, lumbar region: Secondary | ICD-10-CM | POA: Diagnosis not present

## 2022-10-18 DIAGNOSIS — M79605 Pain in left leg: Secondary | ICD-10-CM | POA: Diagnosis not present

## 2022-10-18 DIAGNOSIS — M6281 Muscle weakness (generalized): Secondary | ICD-10-CM | POA: Diagnosis not present

## 2022-10-18 DIAGNOSIS — M79662 Pain in left lower leg: Secondary | ICD-10-CM | POA: Diagnosis not present

## 2022-10-25 DIAGNOSIS — M79662 Pain in left lower leg: Secondary | ICD-10-CM | POA: Diagnosis not present

## 2022-10-25 DIAGNOSIS — M79605 Pain in left leg: Secondary | ICD-10-CM | POA: Diagnosis not present

## 2022-10-25 DIAGNOSIS — M2569 Stiffness of other specified joint, not elsewhere classified: Secondary | ICD-10-CM | POA: Diagnosis not present

## 2022-10-25 DIAGNOSIS — M5416 Radiculopathy, lumbar region: Secondary | ICD-10-CM | POA: Diagnosis not present

## 2022-10-25 DIAGNOSIS — M6281 Muscle weakness (generalized): Secondary | ICD-10-CM | POA: Diagnosis not present

## 2022-11-03 DIAGNOSIS — M5416 Radiculopathy, lumbar region: Secondary | ICD-10-CM | POA: Diagnosis not present

## 2022-11-03 DIAGNOSIS — M79605 Pain in left leg: Secondary | ICD-10-CM | POA: Diagnosis not present

## 2022-11-03 DIAGNOSIS — M79662 Pain in left lower leg: Secondary | ICD-10-CM | POA: Diagnosis not present

## 2022-11-03 DIAGNOSIS — M6281 Muscle weakness (generalized): Secondary | ICD-10-CM | POA: Diagnosis not present

## 2022-11-03 DIAGNOSIS — M2569 Stiffness of other specified joint, not elsewhere classified: Secondary | ICD-10-CM | POA: Diagnosis not present

## 2022-11-07 DIAGNOSIS — M5416 Radiculopathy, lumbar region: Secondary | ICD-10-CM | POA: Diagnosis not present

## 2022-11-07 DIAGNOSIS — M2569 Stiffness of other specified joint, not elsewhere classified: Secondary | ICD-10-CM | POA: Diagnosis not present

## 2022-11-07 DIAGNOSIS — M79662 Pain in left lower leg: Secondary | ICD-10-CM | POA: Diagnosis not present

## 2022-11-07 DIAGNOSIS — M6281 Muscle weakness (generalized): Secondary | ICD-10-CM | POA: Diagnosis not present

## 2022-11-07 DIAGNOSIS — M79605 Pain in left leg: Secondary | ICD-10-CM | POA: Diagnosis not present

## 2022-11-08 DIAGNOSIS — M5416 Radiculopathy, lumbar region: Secondary | ICD-10-CM | POA: Diagnosis not present

## 2022-11-28 DIAGNOSIS — M48061 Spinal stenosis, lumbar region without neurogenic claudication: Secondary | ICD-10-CM | POA: Diagnosis not present

## 2022-11-28 DIAGNOSIS — M5117 Intervertebral disc disorders with radiculopathy, lumbosacral region: Secondary | ICD-10-CM | POA: Diagnosis not present

## 2022-12-19 DIAGNOSIS — E1169 Type 2 diabetes mellitus with other specified complication: Secondary | ICD-10-CM | POA: Diagnosis not present

## 2022-12-19 DIAGNOSIS — M79606 Pain in leg, unspecified: Secondary | ICD-10-CM | POA: Diagnosis not present

## 2022-12-19 DIAGNOSIS — N1831 Chronic kidney disease, stage 3a: Secondary | ICD-10-CM | POA: Diagnosis not present

## 2022-12-19 DIAGNOSIS — R600 Localized edema: Secondary | ICD-10-CM | POA: Diagnosis not present

## 2022-12-19 DIAGNOSIS — E1122 Type 2 diabetes mellitus with diabetic chronic kidney disease: Secondary | ICD-10-CM | POA: Diagnosis not present

## 2022-12-20 ENCOUNTER — Ambulatory Visit (HOSPITAL_COMMUNITY): Admission: RE | Admit: 2022-12-20 | Payer: Medicare Other | Source: Ambulatory Visit

## 2022-12-20 ENCOUNTER — Telehealth (HOSPITAL_COMMUNITY): Payer: Self-pay

## 2022-12-20 ENCOUNTER — Other Ambulatory Visit (HOSPITAL_COMMUNITY): Payer: Self-pay | Admitting: Family Medicine

## 2022-12-20 DIAGNOSIS — M79605 Pain in left leg: Secondary | ICD-10-CM

## 2022-12-20 NOTE — Telephone Encounter (Signed)
Patient called this morning to reschedule appointment due to "not being made aware" patient refused appointment dates/times till the one scheduled.  Due to distance from order date contacted referring office, referring office requests stat, patient not answer.  Returned call to referring office, they stated they would reach out to the patient to inform signs and symptoms of PE as well as attempt to inform the urgency of having their study completed in a timely manner.

## 2022-12-26 ENCOUNTER — Ambulatory Visit (HOSPITAL_COMMUNITY)
Admission: RE | Admit: 2022-12-26 | Discharge: 2022-12-26 | Disposition: A | Discharge status: Home / Self Care | Payer: Medicare Other | Source: Ambulatory Visit | Attending: Family Medicine | Admitting: Family Medicine

## 2022-12-26 DIAGNOSIS — M79605 Pain in left leg: Secondary | ICD-10-CM | POA: Diagnosis not present

## 2023-01-22 DIAGNOSIS — H524 Presbyopia: Secondary | ICD-10-CM | POA: Diagnosis not present

## 2023-01-22 DIAGNOSIS — E119 Type 2 diabetes mellitus without complications: Secondary | ICD-10-CM | POA: Diagnosis not present

## 2023-01-22 DIAGNOSIS — H2513 Age-related nuclear cataract, bilateral: Secondary | ICD-10-CM | POA: Diagnosis not present

## 2023-01-25 DIAGNOSIS — M5416 Radiculopathy, lumbar region: Secondary | ICD-10-CM | POA: Diagnosis not present

## 2023-01-25 DIAGNOSIS — M5127 Other intervertebral disc displacement, lumbosacral region: Secondary | ICD-10-CM | POA: Diagnosis not present

## 2023-01-29 DIAGNOSIS — W010XXA Fall on same level from slipping, tripping and stumbling without subsequent striking against object, initial encounter: Secondary | ICD-10-CM | POA: Diagnosis not present

## 2023-01-29 DIAGNOSIS — M79672 Pain in left foot: Secondary | ICD-10-CM | POA: Diagnosis not present

## 2023-01-29 DIAGNOSIS — M7989 Other specified soft tissue disorders: Secondary | ICD-10-CM | POA: Diagnosis not present

## 2023-01-29 DIAGNOSIS — S92912A Unspecified fracture of left toe(s), initial encounter for closed fracture: Secondary | ICD-10-CM | POA: Diagnosis not present

## 2023-02-15 DIAGNOSIS — I10 Essential (primary) hypertension: Secondary | ICD-10-CM | POA: Diagnosis not present

## 2023-02-15 DIAGNOSIS — E119 Type 2 diabetes mellitus without complications: Secondary | ICD-10-CM | POA: Diagnosis not present

## 2023-02-15 DIAGNOSIS — E876 Hypokalemia: Secondary | ICD-10-CM | POA: Diagnosis not present

## 2023-02-15 DIAGNOSIS — R609 Edema, unspecified: Secondary | ICD-10-CM | POA: Diagnosis not present

## 2023-02-15 DIAGNOSIS — M519 Unspecified thoracic, thoracolumbar and lumbosacral intervertebral disc disorder: Secondary | ICD-10-CM | POA: Diagnosis not present

## 2023-03-02 DIAGNOSIS — M5459 Other low back pain: Secondary | ICD-10-CM | POA: Diagnosis not present

## 2023-03-09 DIAGNOSIS — M5416 Radiculopathy, lumbar region: Secondary | ICD-10-CM | POA: Diagnosis not present

## 2023-04-06 DIAGNOSIS — M542 Cervicalgia: Secondary | ICD-10-CM | POA: Diagnosis not present

## 2023-04-06 DIAGNOSIS — M549 Dorsalgia, unspecified: Secondary | ICD-10-CM | POA: Diagnosis not present

## 2023-04-06 DIAGNOSIS — R609 Edema, unspecified: Secondary | ICD-10-CM | POA: Diagnosis not present

## 2023-04-06 DIAGNOSIS — I10 Essential (primary) hypertension: Secondary | ICD-10-CM | POA: Diagnosis not present

## 2023-04-19 ENCOUNTER — Ambulatory Visit
Admission: RE | Admit: 2023-04-19 | Discharge: 2023-04-19 | Disposition: A | Discharge status: Home / Self Care | Payer: Medicare Other | Source: Ambulatory Visit | Attending: Family Medicine | Admitting: Family Medicine

## 2023-04-19 ENCOUNTER — Other Ambulatory Visit: Payer: Self-pay | Admitting: Family Medicine

## 2023-04-19 DIAGNOSIS — M542 Cervicalgia: Secondary | ICD-10-CM

## 2023-06-01 DIAGNOSIS — I10 Essential (primary) hypertension: Secondary | ICD-10-CM | POA: Diagnosis not present

## 2023-06-01 DIAGNOSIS — Z01818 Encounter for other preprocedural examination: Secondary | ICD-10-CM | POA: Diagnosis not present

## 2023-06-01 DIAGNOSIS — E1169 Type 2 diabetes mellitus with other specified complication: Secondary | ICD-10-CM | POA: Diagnosis not present

## 2023-06-01 DIAGNOSIS — E119 Type 2 diabetes mellitus without complications: Secondary | ICD-10-CM | POA: Diagnosis not present

## 2023-06-13 ENCOUNTER — Other Ambulatory Visit: Payer: Self-pay | Admitting: Neurological Surgery

## 2023-06-15 DIAGNOSIS — I10 Essential (primary) hypertension: Secondary | ICD-10-CM | POA: Diagnosis not present

## 2023-06-29 ENCOUNTER — Other Ambulatory Visit: Payer: Self-pay | Admitting: Neurological Surgery

## 2023-06-29 DIAGNOSIS — I10 Essential (primary) hypertension: Secondary | ICD-10-CM | POA: Diagnosis not present

## 2023-07-07 DIAGNOSIS — Z1231 Encounter for screening mammogram for malignant neoplasm of breast: Secondary | ICD-10-CM | POA: Diagnosis not present

## 2023-07-12 DIAGNOSIS — I10 Essential (primary) hypertension: Secondary | ICD-10-CM | POA: Diagnosis not present

## 2023-07-12 DIAGNOSIS — Z23 Encounter for immunization: Secondary | ICD-10-CM | POA: Diagnosis not present

## 2023-07-17 NOTE — Pre-Procedure Instructions (Signed)
Surgical Instructions   Your procedure is scheduled on July 24, 2023. Report to [facility] Main Entrance "A" at 5:30 A.M., then check in with the Admitting office. Any questions or running late day of surgery: call [phone]  Questions prior to your surgery date: call [phone], Monday-Friday, 8am-4pm. If you experience any cold or flu symptoms such as cough, fever, chills, shortness of breath, etc. between now and your scheduled surgery, please notify us at the above number.     Remember:  Do not eat or drink after midnight the night before your surgery    Take these medicines the morning of surgery with A SIP OF WATER: amLODipine (NORVASC)  gabapentin (NEURONTIN)    One week prior to surgery, STOP taking any Aspirin (unless otherwise instructed by your surgeon) Aleve, Naproxen, Ibuprofen, Motrin, Advil, Goody's, BC's, all herbal medications, fish oil, and non-prescription vitamins. This includes your medication: meloxicam (MOBIC)    WHAT DO I DO ABOUT MY DIABETES MEDICATION?   Do not take metFORMIN (GLUCOPHAGE) the morning of surgery.   HOW TO MANAGE YOUR DIABETES BEFORE AND AFTER SURGERY  Why is it important to control my blood sugar before and after surgery? Improving blood sugar levels before and after surgery helps healing and can limit problems. A way of improving blood sugar control is eating a healthy diet by:  Eating less sugar and carbohydrates  Increasing activity/exercise  Talking with your doctor about reaching your blood sugar goals High blood sugars (greater than 180 mg/dL) can raise your risk of infections and slow your recovery, so you will need to focus on controlling your diabetes during the weeks before surgery. Make sure that the doctor who takes care of your diabetes knows about your planned surgery including the date and location.  How do I manage my blood sugar before surgery? Check your blood sugar at least 4 times a day, starting 2 days  before surgery, to make sure that the level is not too high or low.  Check your blood sugar the morning of your surgery when you wake up and every 2 hours until you get to the Short Stay unit.  If your blood sugar is less than 70 mg/dL, you will need to treat for low blood sugar: Do not take insulin. Treat a low blood sugar (less than 70 mg/dL) with  cup of clear juice (cranberry or apple), 4 glucose tablets, OR glucose gel. Recheck blood sugar in 15 minutes after treatment (to make sure it is greater than 70 mg/dL). If your blood sugar is not greater than 70 mg/dL on recheck, call [phone] for further instructions. Report your blood sugar to the short stay nurse when you get to Short Stay.  If you are admitted to the hospital after surgery: Your blood sugar will be checked by the staff and you will probably be given insulin after surgery (instead of oral diabetes medicines) to make sure you have good blood sugar levels. The goal for blood sugar control after surgery is 80-180 mg/dL.                      Do NOT Smoke (Tobacco/Vaping) for 24 hours prior to your procedure.  If you use a CPAP at night, you may bring your mask/headgear for your overnight stay.   You will be asked to remove any contacts, glasses, piercing's, hearing aid's, dentures/partials prior to surgery. Please bring cases for these items if needed.    Patients discharged the day of  surgery will not be allowed to drive home, and someone needs to stay with them for 24 hours.  SURGICAL WAITING ROOM VISITATION Patients may have no more than 2 support people in the waiting area - these visitors may rotate.   Pre-op nurse will coordinate an appropriate time for 1 ADULT support person, who may not rotate, to accompany patient in pre-op.  Children under the age of 61 must have an adult with them who is not the patient and must remain in the main waiting area with an adult.  If the patient needs to stay at the hospital  during part of their recovery, the visitor guidelines for inpatient rooms apply.  Please refer to the [facility] website for the visitor guidelines for any additional information.   If you received a COVID test during your pre-op visit  it is requested that you wear a mask when out in public, stay away from anyone that may not be feeling well and notify your surgeon if you develop symptoms. If you have been in contact with anyone that has tested positive in the last 10 days please notify you surgeon.      Pre-operative 5 CHG Bathing Instructions   You can play a key role in reducing the risk of infection after surgery. Your skin needs to be as free of germs as possible. You can reduce the number of germs on your skin by washing with CHG (chlorhexidine gluconate) soap before surgery. CHG is an antiseptic soap that kills germs and continues to kill germs even after washing.   DO NOT use if you have an allergy to chlorhexidine/CHG or antibacterial soaps. If your skin becomes reddened or irritated, stop using the CHG and notify one of our RNs at [phone].   Please shower with the CHG soap starting 4 days before surgery using the following schedule:     Please keep in mind the following:  DO NOT shave, including legs and underarms, starting the day of your first shower.   You may shave your face at any point before/day of surgery.  Place clean sheets on your bed the day you start using CHG soap. Use a clean washcloth (not used since being washed) for each shower. DO NOT sleep with pets once you start using the CHG.   CHG Shower Instructions:  Wash your face and private area with normal soap. If you choose to wash your hair, wash first with your normal shampoo.  After you use shampoo/soap, rinse your hair and body thoroughly to remove shampoo/soap residue.  Turn the water OFF and apply about 3 tablespoons (45 ml) of CHG soap to a CLEAN washcloth.  Apply CHG soap ONLY FROM YOUR NECK DOWN  TO YOUR TOES (washing for 3-5 minutes)  DO NOT use CHG soap on face, private areas, open wounds, or sores.  Pay special attention to the area where your surgery is being performed.  If you are having back surgery, having someone wash your back for you may be helpful. Wait 2 minutes after CHG soap is applied, then you may rinse off the CHG soap.  Pat dry with a clean towel  Put on clean clothes/pajamas   If you choose to wear lotion, please use ONLY the CHG-compatible lotions on the back of this paper.   Additional instructions for the day of surgery: DO NOT APPLY any lotions, deodorants, cologne, or perfumes.   Do not bring valuables to the hospital. [facility] is not responsible for any belongings/valuables.  Do not wear nail polish, gel polish, artificial nails, or any other type of covering on natural nails (fingers and toes) Do not wear jewelry or makeup Put on clean/comfortable clothes.  Please brush your teeth.  Ask your nurse before applying any prescription medications to the skin.     CHG Compatible Lotions   Aveeno Moisturizing lotion  Cetaphil Moisturizing Cream  Cetaphil Moisturizing Lotion  Clairol Herbal Essence Moisturizing Lotion, Dry Skin  Clairol Herbal Essence Moisturizing Lotion, Extra Dry Skin  Clairol Herbal Essence Moisturizing Lotion, Normal Skin  Curel Age Defying Therapeutic Moisturizing Lotion with Alpha Hydroxy  Curel Extreme Care Body Lotion  Curel Soothing Hands Moisturizing Hand Lotion  Curel Therapeutic Moisturizing Cream, Fragrance-Free  Curel Therapeutic Moisturizing Lotion, Fragrance-Free  Curel Therapeutic Moisturizing Lotion, Original Formula  Eucerin Daily Replenishing Lotion  Eucerin Dry Skin Therapy Plus Alpha Hydroxy Crme  Eucerin Dry Skin Therapy Plus Alpha Hydroxy Lotion  Eucerin Original Crme  Eucerin Original Lotion  Eucerin Plus Crme Eucerin Plus Lotion  Eucerin TriLipid Replenishing Lotion  Keri Anti-Bacterial Hand Lotion   Keri Deep Conditioning Original Lotion Dry Skin Formula Softly Scented  Keri Deep Conditioning Original Lotion, Fragrance Free Sensitive Skin Formula  Keri Lotion Fast Absorbing Fragrance Free Sensitive Skin Formula  Keri Lotion Fast Absorbing Softly Scented Dry Skin Formula  Keri Original Lotion  Keri Skin Renewal Lotion Keri Silky Smooth Lotion  Keri Silky Smooth Sensitive Skin Lotion  Nivea Body Creamy Conditioning Oil  Nivea Body Extra Enriched Lotion  Nivea Body Original Lotion  Nivea Body Sheer Moisturizing Lotion Nivea Crme  Nivea Skin Firming Lotion  NutraDerm 30 Skin Lotion  NutraDerm Skin Lotion  NutraDerm Therapeutic Skin Cream  NutraDerm Therapeutic Skin Lotion  ProShield Protective Hand Cream  Provon moisturizing lotion  Please read over the following fact sheets that you were given.

## 2023-07-18 ENCOUNTER — Other Ambulatory Visit: Payer: Self-pay

## 2023-07-18 ENCOUNTER — Encounter (HOSPITAL_COMMUNITY)
Admission: RE | Admit: 2023-07-18 | Discharge: 2023-07-18 | Disposition: A | Discharge status: Home / Self Care | Payer: Medicare Other | Source: Ambulatory Visit | Attending: Neurological Surgery | Admitting: Neurological Surgery

## 2023-07-18 ENCOUNTER — Encounter (HOSPITAL_COMMUNITY): Payer: Self-pay

## 2023-07-18 VITALS — BP 151/58 | HR 86 | Temp 98.6°F | Resp 18 | Ht 62.0 in | Wt 183.1 lb

## 2023-07-18 DIAGNOSIS — E119 Type 2 diabetes mellitus without complications: Secondary | ICD-10-CM | POA: Insufficient documentation

## 2023-07-18 DIAGNOSIS — Z01818 Encounter for other preprocedural examination: Secondary | ICD-10-CM

## 2023-07-18 DIAGNOSIS — Z01812 Encounter for preprocedural laboratory examination: Secondary | ICD-10-CM | POA: Diagnosis not present

## 2023-07-18 HISTORY — DX: Pneumonia, unspecified organism: J18.9

## 2023-07-18 HISTORY — DX: Other specified postprocedural states: Z98.890

## 2023-07-18 HISTORY — DX: Other complications of anesthesia, initial encounter: T88.59XA

## 2023-07-18 HISTORY — DX: Unspecified osteoarthritis, unspecified site: M19.90

## 2023-07-18 LAB — TYPE AND SCREEN
ABO/RH(D): O NEG
Antibody Screen: NEGATIVE

## 2023-07-18 LAB — SURGICAL PCR SCREEN
MRSA, PCR: NEGATIVE
Staphylococcus aureus: NEGATIVE

## 2023-07-18 LAB — CBC
HCT: 45.5 % (ref 36.0–46.0)
Hemoglobin: 14.1 g/dL (ref 12.0–15.0)
MCH: 28.1 pg (ref 26.0–34.0)
MCHC: 31 g/dL (ref 30.0–36.0)
MCV: 90.8 fL (ref 80.0–100.0)
Platelets: 262 10*3/uL (ref 150–400)
RBC: 5.01 MIL/uL (ref 3.87–5.11)
RDW: 13.6 % (ref 11.5–15.5)
WBC: 4.1 10*3/uL (ref 4.0–10.5)
nRBC: 0 % (ref 0.0–0.2)

## 2023-07-18 LAB — HEMOGLOBIN A1C
Hgb A1c MFr Bld: 6 % — ABNORMAL HIGH (ref 4.8–5.6)
Mean Plasma Glucose: 125.5 mg/dL

## 2023-07-18 NOTE — Progress Notes (Signed)
PCP - Dr. Farris Has Cardiologist - Dr. Vernona Rieger. 03/02/2020 for arm pain. Echo/Stress test normal. PRN follow-up  PPM/ICD - Denies Device Orders - n/a Rep Notified - n/a  Chest x-ray - Denies EKG - Per pt, she completed EKG at PCP within the past 2-3 months. Tracing requested. Stress Test - 05/19/2020 ECHO - 05/19/2020 Cardiac Cath - Denies  Sleep Study - Denies CPAP - n/a  Pt is DM2. She checks blood sugar 1x/week. Normal fasting blood sugar 100s. A1c result pending. CBG not collected at visit.  Last dose of GLP1 agonist- n/a GLP1 instructions: n/a  Blood Thinner Instructions: n/a Aspirin Instructions: n/a  NPO after midnight  COVID TEST- n/a   Anesthesia review: Yes. Medical Clearance and EKG tracing requested.    Patient denies shortness of breath, fever, cough and chest pain at PAT appointment. Pt denies any respiratory illness/infection in the last two months.    All instructions explained to the patient, with a verbal understanding of the material. Patient agrees to go over the instructions while at home for a better understanding. Patient also instructed to self quarantine after being tested for COVID-19. The opportunity to ask questions was provided.

## 2023-07-19 NOTE — Anesthesia Preprocedure Evaluation (Addendum)
Anesthesia Evaluation  Patient identified by MRN, date of birth, ID band Patient awake    Reviewed: Allergy & Precautions, NPO status , Patient's Chart, lab work & pertinent test results  History of Anesthesia Complications (+) PONV  Airway Mallampati: II  TM Distance: >3 FB Neck ROM: Full    Dental  (+) Dental Advisory Given   Pulmonary neg pulmonary ROS   breath sounds clear to auscultation       Cardiovascular hypertension, Pt. on medications (-) angina  Rhythm:Regular Rate:Normal  '21 Stress ECHO: normal contractility at rest and stress   Neuro/Psych  Headaches    GI/Hepatic Neg liver ROS, PUD,,,  Endo/Other  diabetes (glu 112), Oral Hypoglycemic Agents  BMI 33.5  Renal/GU negative Renal ROS     Musculoskeletal   Abdominal   Peds  Hematology negative hematology ROS (+)   Anesthesia Other Findings   Reproductive/Obstetrics                              Anesthesia Physical Anesthesia Plan  ASA: 3  Anesthesia Plan: General   Post-op Pain Management: Tylenol PO (pre-op)*   Induction: Intravenous  PONV Risk Score and Plan: 4 or greater and Ondansetron, Dexamethasone and Treatment may vary due to age or medical condition  Airway Management Planned: Oral ETT  Additional Equipment: None  Intra-op Plan:   Post-operative Plan: Extubation in OR  Informed Consent: I have reviewed the patients History and Physical, chart, labs and discussed the procedure including the risks, benefits and alternatives for the proposed anesthesia with the patient or authorized representative who has indicated his/her understanding and acceptance.     Dental advisory given  Plan Discussed with: CRNA and Surgeon  Anesthesia Plan Comments: (PAT note written 07/19/2023 by Shonna Chock, PA-C.  )        Anesthesia Quick Evaluation

## 2023-07-19 NOTE — Progress Notes (Addendum)
Anesthesia Chart Review:  Case: [id number] Date/Time: 07/24/23 0715   Procedure: PLIF - L4-L5 - L5-S1 - Posterior Lateral and Interbody fusion with augmentation (Back)   Anesthesia type: General   Pre-op diagnosis: Stenosis   Location: [facility] OR ROOM 21 / [facility] OR   Surgeons: Barnett Abu, MD       DISCUSSION: Patient is a 72 year old female scheduled for the above procedure.  History includes never smoker, postoperative N/V, HTN, HLD, DM2, PUD, spinal surgery. BMI is consistent with obesity. Normal stress echo in 2021.  She had preoperative evaluation with Farris Has, MD on 06/01/23 with EKG and labs. BP was 159/76, so spironolactone 25 mg ordered. Copy of note, EKG, and most recent BMP through [facility] are on her shadow chart. K 4.7, Creatinine slightly elevated at 1.37, so he decreased her dose to 12.5 mg and recommended good hydration.  Anesthesia team to evaluate on the day of surgery.   VS: BP (!) 151/58   Pulse 86   Temp 37 C (Oral)   Resp 18   Ht 5\' 2"  (1.575 m)   Wt 83.1 kg   SpO2 98%   BMI 33.49 kg/m    PROVIDERS: Farris Has, MD is PCP  - She is not routinely followed by a cardiologist. Last visti 03/02/20 with Vernona Rieger, with [facility]. She was reported some pins in her left and right arms with exercise and wanted to get checked out. No significant SOB. He noted, "Her previous work-up had demonstrated some PACs and PVCs. She also had also been having some swelling in her legs. A stress echo in 2017 showed no evidence of any significant ischemia." He ordered another stress echo which was done on 05/19/20 and was normal.   LABS: Labs reviewed: Acceptable for surgery. (all labs ordered are listed, but only abnormal results are displayed)  Labs Reviewed  HEMOGLOBIN A1C - Abnormal; Notable for the following components:      Result Value   Hgb A1c MFr Bld 6.0 (*)    All other components within normal limits  SURGICAL PCR SCREEN  CBC  TYPE AND SCREEN   She had  a BMP through [facility] on 07/12/23 or 07/13/23 that  showed: Glucose 82, BUN 26, creatinine 1.37 (up from 1.01 06/01/23), EGFR 41, sodium 140, potassium 4.7, calcium 10.3, calcium corrected 9.93, albumin 4.4. PCP reduced spironolactone to 12.5 mg daily.   IMAGES: MRI L-spine 01/25/23 ([facility]/P[facility]): IMPRESSION: 1. At L4-L5, right foraminal/subarticular disc protrusion results in severe right subarticular recess stenosis and moderate right foraminal stenosis. Moderate left subarticular recess stenosis at this level with mild central canal stenosis. Findings are mildly progressed. 2. At L5-S1, moderate left foraminal stenosis with far lateral/extraforaminal disc/osteophyte closely approximating the exiting/exited left L5 nerve. Mild to moderate bilateral subarticular recess stenosis at this level. Left subarticular recess stenosis is mildly improved with interval left-sided postoperative change. 3. Additional multilevel mild subarticular recess and foraminal stenosis is detailed above.   EKG:06/01/23 ([facility]): SR. Occasional PVC. Baseline wanderer primarily in inferior leads.   CV: Stress echo 05/19/20 ([facility]): STRESS ECHO  Normal left ventricular function and global wall motion with stress.  There were no segmental wall motion abnormalities post exercise. There  was normal increase in global LV function post exercise. Negative  exercise echocardiography for inducible ischemia at target heart rate.    Past Medical History:  Diagnosis Date   Arthritis    Complication of anesthesia    Diabetes mellitus  Hyperlipidemia    Hypertension    Obesity    Personal history of other genital system and obstetric disorders(V13.29)    Pneumonia    1990s   PONV (postoperative nausea and vomiting)    PUD (peptic ulcer disease)     Past Surgical History:  Procedure Laterality Date   ABDOMINAL HYSTERECTOMY     1990s. Per pt, she was told no GYN surgeries due to increased scar tissue    CARPAL TUNNEL RELEASE Right    KNEE ARTHROSCOPY Right    NECK SURGERY     1990s. Cervical Spine   OOPHORECTOMY     TONSILLECTOMY     As a child   TOOTH EXTRACTION     TUBAL LIGATION      MEDICATIONS:  amLODipine (NORVASC) 5 MG tablet   gabapentin (NEURONTIN) 300 MG capsule   meloxicam (MOBIC) 15 MG tablet   metFORMIN (GLUCOPHAGE) 500 MG tablet   methylPREDNISolone (MEDROL DOSEPAK) 4 MG TBPK tablet   spironolactone (ALDACTONE) 25 MG tablet   telmisartan (MICARDIS) 40 MG tablet   Triamcinolone Acetonide (TRIAMCINOLONE 0.1 % CREAM : EUCERIN) CREA    betamethasone acetate-betamethasone sodium phosphate (CELESTONE) injection 3 mg    Shonna Chock, PA-C Surgical Short Stay/Anesthesiology [facility] Phone [phone] [facility] Phone [phone] 07/19/2023 6:05 PM

## 2023-07-24 ENCOUNTER — Inpatient Hospital Stay (HOSPITAL_COMMUNITY)
Admission: RE | Admit: 2023-07-24 | Discharge: 2023-07-27 | DRG: 455 | Disposition: A | Discharge status: Home Health | Payer: Medicare Other | Attending: Neurological Surgery | Admitting: Neurological Surgery

## 2023-07-24 ENCOUNTER — Inpatient Hospital Stay (HOSPITAL_COMMUNITY): Payer: Medicare Other | Admitting: Anesthesiology

## 2023-07-24 ENCOUNTER — Inpatient Hospital Stay (HOSPITAL_COMMUNITY): Payer: Medicare Other

## 2023-07-24 ENCOUNTER — Inpatient Hospital Stay (HOSPITAL_COMMUNITY)
Admission: RE | Disposition: A | Discharge status: Home Health | Payer: Self-pay | Source: Home / Self Care | Attending: Neurological Surgery

## 2023-07-24 ENCOUNTER — Inpatient Hospital Stay (HOSPITAL_COMMUNITY): Payer: Medicare Other | Admitting: Vascular Surgery

## 2023-07-24 ENCOUNTER — Other Ambulatory Visit: Payer: Self-pay

## 2023-07-24 ENCOUNTER — Encounter (HOSPITAL_COMMUNITY): Payer: Self-pay | Admitting: Neurological Surgery

## 2023-07-24 DIAGNOSIS — E785 Hyperlipidemia, unspecified: Secondary | ICD-10-CM | POA: Diagnosis present

## 2023-07-24 DIAGNOSIS — Z8249 Family history of ischemic heart disease and other diseases of the circulatory system: Secondary | ICD-10-CM

## 2023-07-24 DIAGNOSIS — E669 Obesity, unspecified: Secondary | ICD-10-CM | POA: Diagnosis present

## 2023-07-24 DIAGNOSIS — M79605 Pain in left leg: Secondary | ICD-10-CM | POA: Diagnosis present

## 2023-07-24 DIAGNOSIS — I10 Essential (primary) hypertension: Secondary | ICD-10-CM | POA: Diagnosis present

## 2023-07-24 DIAGNOSIS — M47816 Spondylosis without myelopathy or radiculopathy, lumbar region: Secondary | ICD-10-CM | POA: Diagnosis not present

## 2023-07-24 DIAGNOSIS — E119 Type 2 diabetes mellitus without complications: Secondary | ICD-10-CM | POA: Diagnosis not present

## 2023-07-24 DIAGNOSIS — Z882 Allergy status to sulfonamides status: Secondary | ICD-10-CM

## 2023-07-24 DIAGNOSIS — Z90710 Acquired absence of both cervix and uterus: Secondary | ICD-10-CM

## 2023-07-24 DIAGNOSIS — Z981 Arthrodesis status: Secondary | ICD-10-CM | POA: Diagnosis not present

## 2023-07-24 DIAGNOSIS — M5116 Intervertebral disc disorders with radiculopathy, lumbar region: Secondary | ICD-10-CM | POA: Diagnosis present

## 2023-07-24 DIAGNOSIS — M4726 Other spondylosis with radiculopathy, lumbar region: Secondary | ICD-10-CM | POA: Diagnosis not present

## 2023-07-24 DIAGNOSIS — M48061 Spinal stenosis, lumbar region without neurogenic claudication: Secondary | ICD-10-CM

## 2023-07-24 DIAGNOSIS — Z6833 Body mass index (BMI) 33.0-33.9, adult: Secondary | ICD-10-CM | POA: Diagnosis not present

## 2023-07-24 DIAGNOSIS — M4727 Other spondylosis with radiculopathy, lumbosacral region: Secondary | ICD-10-CM | POA: Diagnosis present

## 2023-07-24 DIAGNOSIS — Z8711 Personal history of peptic ulcer disease: Secondary | ICD-10-CM

## 2023-07-24 DIAGNOSIS — Z833 Family history of diabetes mellitus: Secondary | ICD-10-CM | POA: Diagnosis not present

## 2023-07-24 DIAGNOSIS — M4807 Spinal stenosis, lumbosacral region: Secondary | ICD-10-CM | POA: Diagnosis not present

## 2023-07-24 DIAGNOSIS — Z79899 Other long term (current) drug therapy: Secondary | ICD-10-CM

## 2023-07-24 DIAGNOSIS — M1990 Unspecified osteoarthritis, unspecified site: Secondary | ICD-10-CM | POA: Diagnosis not present

## 2023-07-24 DIAGNOSIS — Z0189 Encounter for other specified special examinations: Secondary | ICD-10-CM | POA: Diagnosis not present

## 2023-07-24 DIAGNOSIS — M5416 Radiculopathy, lumbar region: Secondary | ICD-10-CM | POA: Diagnosis not present

## 2023-07-24 DIAGNOSIS — Z888 Allergy status to other drugs, medicaments and biological substances status: Secondary | ICD-10-CM

## 2023-07-24 DIAGNOSIS — M5127 Other intervertebral disc displacement, lumbosacral region: Secondary | ICD-10-CM | POA: Diagnosis present

## 2023-07-24 DIAGNOSIS — Z7984 Long term (current) use of oral hypoglycemic drugs: Secondary | ICD-10-CM

## 2023-07-24 DIAGNOSIS — M4716 Other spondylosis with myelopathy, lumbar region: Secondary | ICD-10-CM | POA: Diagnosis not present

## 2023-07-24 LAB — GLUCOSE, CAPILLARY
Glucose-Capillary: 112 mg/dL — ABNORMAL HIGH (ref 70–99)
Glucose-Capillary: 138 mg/dL — ABNORMAL HIGH (ref 70–99)
Glucose-Capillary: 149 mg/dL — ABNORMAL HIGH (ref 70–99)
Glucose-Capillary: 135 mg/dL — ABNORMAL HIGH (ref 70–99)

## 2023-07-24 LAB — ABO/RH: ABO/RH(D): O NEG

## 2023-07-24 SURGERY — POSTERIOR LUMBAR FUSION 2 LEVEL
Anesthesia: General | Site: Spine Lumbar

## 2023-07-24 MED ORDER — HYDROXYZINE HCL 50 MG/ML IM SOLN
50.0000 mg | Freq: Four times a day (QID) | INTRAMUSCULAR | Status: DC | PRN
  Administered 2023-07-24: 50 mg via INTRAMUSCULAR
  Filled 2023-07-24: qty 1

## 2023-07-24 MED ORDER — IRBESARTAN 150 MG PO TABS
150.0000 mg | ORAL_TABLET | Freq: Every day | ORAL | Status: DC
  Administered 2023-07-24: 150 mg via ORAL
  Filled 2023-07-24: qty 1

## 2023-07-24 MED ORDER — ONDANSETRON HCL 4 MG/2ML IJ SOLN
4.0000 mg | Freq: Four times a day (QID) | INTRAMUSCULAR | Status: DC | PRN
  Administered 2023-07-24: 4 mg via INTRAVENOUS
  Filled 2023-07-24: qty 2

## 2023-07-24 MED ORDER — SUGAMMADEX SODIUM 200 MG/2ML IV SOLN
INTRAVENOUS | Status: DC | PRN
  Administered 2023-07-24: 200 mg via INTRAVENOUS

## 2023-07-24 MED ORDER — PHENYLEPHRINE HCL (PRESSORS) 10 MG/ML IV SOLN
INTRAVENOUS | Status: AC
  Filled 2023-07-24: qty 1

## 2023-07-24 MED ORDER — DEXMEDETOMIDINE HCL IN NACL 80 MCG/20ML IV SOLN
INTRAVENOUS | Status: DC | PRN
Start: 2023-07-24 — End: 2023-07-24
  Administered 2023-07-24: 8 ug via INTRAVENOUS
  Administered 2023-07-24: 4 ug via INTRAVENOUS
  Administered 2023-07-24: 8 ug via INTRAVENOUS

## 2023-07-24 MED ORDER — ACETAMINOPHEN 325 MG PO TABS
650.0000 mg | ORAL_TABLET | ORAL | Status: DC | PRN
  Filled 2023-07-24: qty 2

## 2023-07-24 MED ORDER — ONDANSETRON HCL 4 MG/2ML IJ SOLN
INTRAMUSCULAR | Status: DC | PRN
  Administered 2023-07-24: 4 mg via INTRAVENOUS

## 2023-07-24 MED ORDER — SODIUM CHLORIDE 0.9% FLUSH
3.0000 mL | Freq: Two times a day (BID) | INTRAVENOUS | Status: DC
  Administered 2023-07-24 – 2023-07-25 (×3): 3 mL via INTRAVENOUS

## 2023-07-24 MED ORDER — FENTANYL CITRATE (PF) 250 MCG/5ML IJ SOLN
INTRAMUSCULAR | Status: DC | PRN
  Administered 2023-07-24 (×2): 50 ug via INTRAVENOUS
  Administered 2023-07-24: 150 ug via INTRAVENOUS

## 2023-07-24 MED ORDER — ALBUMIN HUMAN 5 % IV SOLN
INTRAVENOUS | Status: DC | PRN
Start: 2023-07-24 — End: 2023-07-24

## 2023-07-24 MED ORDER — DOCUSATE SODIUM 100 MG PO CAPS
100.0000 mg | ORAL_CAPSULE | Freq: Two times a day (BID) | ORAL | Status: DC
  Administered 2023-07-24 – 2023-07-26 (×3): 100 mg via ORAL
  Filled 2023-07-24 (×3): qty 1

## 2023-07-24 MED ORDER — CEFAZOLIN SODIUM-DEXTROSE 2-4 GM/100ML-% IV SOLN
2.0000 g | Freq: Three times a day (TID) | INTRAVENOUS | Status: AC
  Administered 2023-07-24 – 2023-07-25 (×2): 2 g via INTRAVENOUS
  Filled 2023-07-24 (×2): qty 100

## 2023-07-24 MED ORDER — THROMBIN 5000 UNITS EX SOLR
CUTANEOUS | Status: AC
  Filled 2023-07-24: qty 5000

## 2023-07-24 MED ORDER — CEFAZOLIN SODIUM-DEXTROSE 2-3 GM-%(50ML) IV SOLR
INTRAVENOUS | Status: DC | PRN
  Administered 2023-07-24: 2 g via INTRAVENOUS

## 2023-07-24 MED ORDER — INSULIN ASPART 100 UNIT/ML IJ SOLN: 0.0000 [IU] | INTRAMUSCULAR | Status: DC | PRN

## 2023-07-24 MED ORDER — LACTATED RINGERS IV SOLN: INTRAVENOUS | Status: DC

## 2023-07-24 MED ORDER — ALUM & MAG HYDROXIDE-SIMETH 200-200-20 MG/5ML PO SUSP: 30.0000 mL | Freq: Four times a day (QID) | ORAL | Status: DC | PRN

## 2023-07-24 MED ORDER — ROCURONIUM BROMIDE 10 MG/ML (PF) SYRINGE
PREFILLED_SYRINGE | INTRAVENOUS | Status: DC | PRN
  Administered 2023-07-24: 40 mg via INTRAVENOUS
  Administered 2023-07-24: 60 mg via INTRAVENOUS
  Administered 2023-07-24: 30 mg via INTRAVENOUS

## 2023-07-24 MED ORDER — SODIUM CHLORIDE 0.9 % IV SOLN: 250.0000 mL | INTRAVENOUS | Status: DC

## 2023-07-24 MED ORDER — HYDROMORPHONE HCL 1 MG/ML IJ SOLN
INTRAMUSCULAR | Status: DC | PRN
Start: 2023-07-24 — End: 2023-07-24
  Administered 2023-07-24: .5 mg via INTRAVENOUS

## 2023-07-24 MED ORDER — EPHEDRINE SULFATE-NACL 50-0.9 MG/10ML-% IV SOSY
PREFILLED_SYRINGE | INTRAVENOUS | Status: DC | PRN
  Administered 2023-07-24: 5 mg via INTRAVENOUS
  Administered 2023-07-24 (×2): 10 mg via INTRAVENOUS

## 2023-07-24 MED ORDER — CHLORHEXIDINE GLUCONATE CLOTH 2 % EX PADS: 6.0000 | MEDICATED_PAD | Freq: Once | CUTANEOUS | Status: DC

## 2023-07-24 MED ORDER — CEFAZOLIN SODIUM-DEXTROSE 2-4 GM/100ML-% IV SOLN
2.0000 g | INTRAVENOUS | Status: AC
  Administered 2023-07-24: 2 g via INTRAVENOUS
  Filled 2023-07-24: qty 100

## 2023-07-24 MED ORDER — DEXAMETHASONE SODIUM PHOSPHATE 10 MG/ML IJ SOLN
INTRAMUSCULAR | Status: DC | PRN
  Administered 2023-07-24: 4 mg via INTRAVENOUS

## 2023-07-24 MED ORDER — ONDANSETRON HCL 4 MG PO TABS: 4.0000 mg | ORAL_TABLET | Freq: Four times a day (QID) | ORAL | Status: DC | PRN

## 2023-07-24 MED ORDER — MIDAZOLAM HCL 2 MG/2ML IJ SOLN
INTRAMUSCULAR | Status: AC
  Filled 2023-07-24: qty 2

## 2023-07-24 MED ORDER — CHLORHEXIDINE GLUCONATE 0.12 % MT SOLN
15.0000 mL | Freq: Once | OROMUCOSAL | Status: AC
  Administered 2023-07-24: 15 mL via OROMUCOSAL
  Filled 2023-07-24: qty 15

## 2023-07-24 MED ORDER — MIDAZOLAM HCL 2 MG/2ML IJ SOLN
INTRAMUSCULAR | Status: DC | PRN
  Administered 2023-07-24: 2 mg via INTRAVENOUS

## 2023-07-24 MED ORDER — AMLODIPINE BESYLATE 5 MG PO TABS
5.0000 mg | ORAL_TABLET | Freq: Every day | ORAL | Status: DC
  Administered 2023-07-25: 5 mg via ORAL
  Filled 2023-07-24: qty 1

## 2023-07-24 MED ORDER — HYDROMORPHONE HCL 1 MG/ML IJ SOLN
0.2500 mg | INTRAMUSCULAR | Status: DC | PRN
  Administered 2023-07-24: 0.25 mg via INTRAVENOUS

## 2023-07-24 MED ORDER — POLYETHYLENE GLYCOL 3350 17 G PO PACK: 17.0000 g | PACK | Freq: Every day | ORAL | Status: DC | PRN

## 2023-07-24 MED ORDER — OXYCODONE HCL 5 MG/5ML PO SOLN: 5.0000 mg | Freq: Once | ORAL | Status: DC | PRN

## 2023-07-24 MED ORDER — MENTHOL 3 MG MT LOZG: 1.0000 | LOZENGE | OROMUCOSAL | Status: DC | PRN

## 2023-07-24 MED ORDER — FENTANYL CITRATE (PF) 250 MCG/5ML IJ SOLN
INTRAMUSCULAR | Status: AC
  Filled 2023-07-24: qty 5

## 2023-07-24 MED ORDER — LIDOCAINE-EPINEPHRINE 1 %-1:100000 IJ SOLN
INTRAMUSCULAR | Status: DC | PRN
  Administered 2023-07-24: 5 mL

## 2023-07-24 MED ORDER — ORAL CARE MOUTH RINSE: 15.0000 mL | Freq: Once | OROMUCOSAL | Status: AC

## 2023-07-24 MED ORDER — METHOCARBAMOL 1000 MG/10ML IJ SOLN: 500.0000 mg | Freq: Four times a day (QID) | INTRAVENOUS | Status: DC | PRN

## 2023-07-24 MED ORDER — LIDOCAINE-EPINEPHRINE 1 %-1:100000 IJ SOLN
INTRAMUSCULAR | Status: AC
  Filled 2023-07-24: qty 1

## 2023-07-24 MED ORDER — PROPOFOL 10 MG/ML IV BOLUS
INTRAVENOUS | Status: DC | PRN
  Administered 2023-07-24: 120 mg via INTRAVENOUS

## 2023-07-24 MED ORDER — SPIRONOLACTONE 25 MG PO TABS
25.0000 mg | ORAL_TABLET | Freq: Two times a day (BID) | ORAL | Status: DC
  Administered 2023-07-24 – 2023-07-27 (×4): 25 mg via ORAL
  Filled 2023-07-24 (×4): qty 1

## 2023-07-24 MED ORDER — OXYCODONE HCL 5 MG PO TABS: 5.0000 mg | ORAL_TABLET | Freq: Once | ORAL | Status: DC | PRN

## 2023-07-24 MED ORDER — INSULIN ASPART 100 UNIT/ML IJ SOLN
0.0000 [IU] | Freq: Three times a day (TID) | INTRAMUSCULAR | Status: DC
  Administered 2023-07-24: 3 [IU] via SUBCUTANEOUS

## 2023-07-24 MED ORDER — MIDAZOLAM HCL 2 MG/2ML IJ SOLN: 0.5000 mg | Freq: Once | INTRAMUSCULAR | Status: DC | PRN

## 2023-07-24 MED ORDER — SODIUM CHLORIDE 0.9% FLUSH: 3.0000 mL | INTRAVENOUS | Status: DC | PRN

## 2023-07-24 MED ORDER — PHENOL 1.4 % MT LIQD: 1.0000 | OROMUCOSAL | Status: DC | PRN

## 2023-07-24 MED ORDER — METFORMIN HCL 500 MG PO TABS
500.0000 mg | ORAL_TABLET | Freq: Every day | ORAL | Status: DC
  Administered 2023-07-24 – 2023-07-25 (×2): 500 mg via ORAL
  Filled 2023-07-24 (×2): qty 1

## 2023-07-24 MED ORDER — BISACODYL 10 MG RE SUPP: 10.0000 mg | Freq: Every day | RECTAL | Status: DC | PRN

## 2023-07-24 MED ORDER — PROPOFOL 10 MG/ML IV BOLUS
INTRAVENOUS | Status: AC
  Filled 2023-07-24: qty 20

## 2023-07-24 MED ORDER — SENNA 8.6 MG PO TABS
1.0000 | ORAL_TABLET | Freq: Two times a day (BID) | ORAL | Status: DC
  Administered 2023-07-24 – 2023-07-26 (×3): 8.6 mg via ORAL
  Filled 2023-07-24 (×3): qty 1

## 2023-07-24 MED ORDER — HYDROMORPHONE HCL 1 MG/ML IJ SOLN
INTRAMUSCULAR | Status: AC
  Filled 2023-07-24: qty 1

## 2023-07-24 MED ORDER — OXYCODONE-ACETAMINOPHEN 5-325 MG PO TABS
1.0000 | ORAL_TABLET | Freq: Four times a day (QID) | ORAL | Status: DC | PRN
  Administered 2023-07-24 – 2023-07-25 (×2): 2 via ORAL
  Filled 2023-07-24 (×2): qty 2

## 2023-07-24 MED ORDER — THROMBIN 5000 UNITS EX SOLR
OROMUCOSAL | Status: DC | PRN
  Administered 2023-07-24: 5 mL

## 2023-07-24 MED ORDER — PROMETHAZINE HCL 25 MG/ML IJ SOLN: 6.2500 mg | INTRAMUSCULAR | Status: DC | PRN

## 2023-07-24 MED ORDER — MORPHINE SULFATE (PF) 2 MG/ML IV SOLN: 2.0000 mg | INTRAVENOUS | Status: DC | PRN

## 2023-07-24 MED ORDER — THROMBIN 20000 UNITS EX SOLR
CUTANEOUS | Status: AC
  Filled 2023-07-24: qty 20000

## 2023-07-24 MED ORDER — MEPERIDINE HCL 25 MG/ML IJ SOLN: 6.2500 mg | INTRAMUSCULAR | Status: DC | PRN

## 2023-07-24 MED ORDER — ACETAMINOPHEN 650 MG RE SUPP: 650.0000 mg | RECTAL | Status: DC | PRN

## 2023-07-24 MED ORDER — GABAPENTIN 300 MG PO CAPS
300.0000 mg | ORAL_CAPSULE | Freq: Every morning | ORAL | Status: DC
  Administered 2023-07-25: 300 mg via ORAL
  Filled 2023-07-24: qty 1

## 2023-07-24 MED ORDER — METHOCARBAMOL 500 MG PO TABS
500.0000 mg | ORAL_TABLET | Freq: Four times a day (QID) | ORAL | Status: DC | PRN
  Administered 2023-07-24 – 2023-07-25 (×2): 500 mg via ORAL
  Filled 2023-07-24 (×2): qty 1

## 2023-07-24 MED ORDER — FLEET ENEMA RE ENEM: 1.0000 | ENEMA | Freq: Once | RECTAL | Status: DC | PRN

## 2023-07-24 MED ORDER — 0.9 % SODIUM CHLORIDE (POUR BTL) OPTIME
TOPICAL | Status: DC | PRN
  Administered 2023-07-24: 1000 mL

## 2023-07-24 MED ORDER — HYDROMORPHONE HCL 1 MG/ML IJ SOLN
INTRAMUSCULAR | Status: AC
  Filled 2023-07-24: qty 0.5

## 2023-07-24 MED ORDER — ACETAMINOPHEN 500 MG PO TABS
1000.0000 mg | ORAL_TABLET | Freq: Once | ORAL | Status: AC
  Administered 2023-07-24: 1000 mg via ORAL
  Filled 2023-07-24: qty 2

## 2023-07-24 MED ORDER — BUPIVACAINE HCL (PF) 0.5 % IJ SOLN
INTRAMUSCULAR | Status: AC
  Filled 2023-07-24: qty 30

## 2023-07-24 MED ORDER — BUPIVACAINE HCL (PF) 0.5 % IJ SOLN
INTRAMUSCULAR | Status: DC | PRN
  Administered 2023-07-24: 5 mL
  Administered 2023-07-24: 25 mL

## 2023-07-24 MED ORDER — THROMBIN 20000 UNITS EX SOLR
CUTANEOUS | Status: DC | PRN
  Administered 2023-07-24: 20 mL via TOPICAL

## 2023-07-24 MED ORDER — PHENYLEPHRINE HCL-NACL 20-0.9 MG/250ML-% IV SOLN
INTRAVENOUS | Status: DC | PRN
  Administered 2023-07-24: 30 ug/min via INTRAVENOUS

## 2023-07-24 SURGICAL SUPPLY — 72 items
ADH SKN CLS APL DERMABOND .7 (GAUZE/BANDAGES/DRESSINGS) ×1
APL SRG 60D 8 XTD TIP BNDBL (TIP)
BAG COUNTER SPONGE SURGICOUNT (BAG) ×1 IMPLANT
BAG SPNG CNTER NS LX DISP (BAG) ×1
BASKET BONE COLLECTION (BASKET) ×1 IMPLANT
BLADE BONE MILL MEDIUM (MISCELLANEOUS) ×1 IMPLANT
BLADE CLIPPER SURG (BLADE) IMPLANT
BUR MATCHSTICK NEURO 3.0 LAGG (BURR) ×1 IMPLANT
CAGE COROENT PLIF 10X28-8 LUMB (Cage) IMPLANT
CAGE PLIF 8X9X23-12 LUMBAR (Cage) IMPLANT
CANISTER SUCT 3000ML PPV (MISCELLANEOUS) ×1 IMPLANT
CEMENT KYPHON C01A KIT/MIXER (Cement) IMPLANT
CNTNR URN SCR LID CUP LEK RST (MISCELLANEOUS) ×1 IMPLANT
CONT SPEC 4OZ STRL OR WHT (MISCELLANEOUS) ×1
COVER BACK TABLE 60X90IN (DRAPES) ×1 IMPLANT
DERMABOND ADVANCED .7 DNX12 (GAUZE/BANDAGES/DRESSINGS) ×1 IMPLANT
DEVICE DISSECT PLASMABLAD 3.0S (MISCELLANEOUS) ×1 IMPLANT
DRAPE C-ARM 42X72 X-RAY (DRAPES) ×2 IMPLANT
DRAPE HALF SHEET 40X57 (DRAPES) IMPLANT
DRAPE LAPAROTOMY 100X72X124 (DRAPES) ×1 IMPLANT
DRSG OPSITE POSTOP 4X8 (GAUZE/BANDAGES/DRESSINGS) IMPLANT
DURAPREP 26ML APPLICATOR (WOUND CARE) ×1 IMPLANT
DURASEAL APPLICATOR TIP (TIP) IMPLANT
DURASEAL SPINE SEALANT 3ML (MISCELLANEOUS) IMPLANT
ELECT REM PT RETURN 9FT ADLT (ELECTROSURGICAL) ×1
ELECTRODE REM PT RTRN 9FT ADLT (ELECTROSURGICAL) ×1 IMPLANT
GAUZE 4X4 16PLY ~~LOC~~+RFID DBL (SPONGE) IMPLANT
GAUZE SPONGE 4X4 12PLY STRL (GAUZE/BANDAGES/DRESSINGS) ×1 IMPLANT
GLOVE BIOGEL PI IND STRL 7.5 (GLOVE) IMPLANT
GLOVE BIOGEL PI IND STRL 8.5 (GLOVE) ×2 IMPLANT
GLOVE ECLIPSE 8.5 STRL (GLOVE) ×2 IMPLANT
GOWN STRL REUS W/ TWL LRG LVL3 (GOWN DISPOSABLE) IMPLANT
GOWN STRL REUS W/ TWL XL LVL3 (GOWN DISPOSABLE) IMPLANT
GOWN STRL REUS W/TWL 2XL LVL3 (GOWN DISPOSABLE) ×2 IMPLANT
GOWN STRL REUS W/TWL LRG LVL3 (GOWN DISPOSABLE)
GOWN STRL REUS W/TWL XL LVL3 (GOWN DISPOSABLE)
GRAFT BONE PROTEIOS XL 10CC (Orthopedic Implant) IMPLANT
HEMOSTAT POWDER KIT SURGIFOAM (HEMOSTASIS) ×1 IMPLANT
KIT BASIN OR (CUSTOM PROCEDURE TRAY) ×1 IMPLANT
KIT GRAFTMAG DEL NEURO DISP (NEUROSURGERY SUPPLIES) IMPLANT
KIT TURNOVER KIT B (KITS) ×1 IMPLANT
MILL BONE PREP (MISCELLANEOUS) ×1 IMPLANT
NDL HYPO 22X1.5 SAFETY MO (MISCELLANEOUS) ×1 IMPLANT
NDL SPNL 18GX3.5 QUINCKE PK (NEEDLE) IMPLANT
NEEDLE HYPO 22X1.5 SAFETY MO (MISCELLANEOUS) ×1 IMPLANT
NEEDLE SPNL 18GX3.5 QUINCKE PK (NEEDLE) IMPLANT
NS IRRIG 1000ML POUR BTL (IV SOLUTION) ×1 IMPLANT
PACK LAMINECTOMY NEURO (CUSTOM PROCEDURE TRAY) ×1 IMPLANT
PAD ARMBOARD 7.5X6 YLW CONV (MISCELLANEOUS) ×3 IMPLANT
PATTIES SURGICAL .5 X1 (DISPOSABLE) ×1 IMPLANT
PLASMABLADE 3.0S (MISCELLANEOUS) ×1
PUSHER RELINE FENS MAS (MISCELLANEOUS) IMPLANT
RELINE MAS NDL FENS (NEEDLE) IMPLANT
RELINE MAS NEEDLE FENS (NEEDLE) ×6 IMPLANT
ROD RELIN-O LORD 5.5X65MM (Rod) IMPLANT
SCREW LOCK RELINE 5.5 TULIP (Screw) IMPLANT
SCREW RELINE PA 6.5X45 (Screw) IMPLANT
SPIKE FLUID TRANSFER (MISCELLANEOUS) ×1 IMPLANT
SPONGE SURGIFOAM ABS GEL 100 (HEMOSTASIS) IMPLANT
SPONGE T-LAP 4X18 ~~LOC~~+RFID (SPONGE) IMPLANT
SUT PROLENE 6 0 BV (SUTURE) IMPLANT
SUT VIC AB 1 CT1 18XBRD ANBCTR (SUTURE) ×1 IMPLANT
SUT VIC AB 1 CT1 8-18 (SUTURE) ×1
SUT VIC AB 2-0 CP2 18 (SUTURE) ×1 IMPLANT
SUT VIC AB 3-0 SH 8-18 (SUTURE) ×1 IMPLANT
SUT VIC AB 4-0 RB1 18 (SUTURE) ×1 IMPLANT
SYR 3ML LL SCALE MARK (SYRINGE) ×4 IMPLANT
SYR 5ML LL (SYRINGE) IMPLANT
TOWEL GREEN STERILE (TOWEL DISPOSABLE) ×1 IMPLANT
TOWEL GREEN STERILE FF (TOWEL DISPOSABLE) ×1 IMPLANT
TRAY FOLEY MTR SLVR 16FR STAT (SET/KITS/TRAYS/PACK) ×1 IMPLANT
WATER STERILE IRR 1000ML POUR (IV SOLUTION) ×1 IMPLANT

## 2023-07-24 NOTE — Op Note (Signed)
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 Postoperative diagnosis: Same Procedure: Decompression L4-5 and L5-S1 with more work than required for simple interbody technique.  Posterior lumbar interbody arthrodesis L4-5 and L5-S1 with peek spacers local autograft and Proteos.  Methacrylate augmentation of pedicle screws.  Fluoroscopic guidance. Surgeon: Barnett Abu First Assistant: Monia Pouch, DO Anesthesia: General Endotracheal Indications: [name] is a 72 year old individual whose had significant back and bilateral lower extremity pain has been worse on the right side than on the left.  She had surgical decompression of the disc herniation last year.  She has had worsening of pain and now has significant degenerative disease at L4-5 and L5-S1 and has been advised that surgery would require not only decompression but stabilization from L4 to sacrum.  Procedure: Patient was brought to the operating room supine on the stretcher.  After the smooth induction of general tracheal anesthesia, she was carefully turned prone.  The back was prepped with alcohol DuraPrep and draped in a sterile fashion.  Midline incision was created and carried down to the lumbodorsal fascia which was opened on either side midline.  The laminar arch of L4 was marked positively and verified radiographically in the L4-5 and L5-S1 interspaces were then exposed and the tissues were decorticated from this area.  Self-retaining retractor was placed and the wound and then after adequate exposure we performed bilateral laminotomies removing the entirety of the entire inferior laminar arch including the facet at L4-L5 entire arch of L5 was then removed to expose the common dural tube and the L5 and S1 nerve roots and the takeoff positions.  This was done with the help of Dr. Jake Samples who assisted in providing retraction and suctioning well I worked with  the drill and undercut tissues.  The dura was noted to be bound down on the right side where previous discectomy had been performed.  Mobilizing this identified several other fragments of disc more medially and there was also a cyst in the foraminal zone at the L5-S1 level and L4-L5.  This area was thoroughly decompressed and the L4 nerve root superiorly the L5 nerve root intermediately and the S1 nerve roots inferiorly were all decompressed individually.  Then with the help of Dr. Jake Samples who provided retraction of the nerve roots and protection of the neural structures open of the space at L4-5 and L5-S1.  Complete discectomy was performed with Dr. Mattie Marlin help and the interspaces were distracted.  At L5-S1 but complete discectomy was performed the endplates were decorticated and the interspace was sized for an appropriately sized spacer which was 8 mm tall 23 mm in length 12 degrees lordosis.  This was filled with a combination of the autograft from the laminectomy mixed with Proteus.  Total of 6 cc of bone graft was placed into the interspace and 2 spacers were placed.  Next attention was turned to L4-L5 where the discectomy was carried out in a similar fashion here a total of 9 cc of bone graft along with a spacer measuring 10 mm in height 8 degrees lordosis 23 mm in length was fitted and placed into the interspace.  Once the interbody fusion was completed we then proceeded to select pedicle screw placement at L4-L5 and the sacrum using fluoroscopic guidance and placing pedicle probes using the external landmarks.  Screws were placed easily into the bone the bone was noted to be somewhat soft and for this reason we placed the methacrylate  augmentation into the vertebrae at L4-L5 and the sacrum this was done with fluoroscopic guidance.  Once the augmentation was completed the screws were connected together using a 65 mm precontoured rod in a neutral fashion.  The system was tightened but prior to doing this the  lateral gutters which had been decorticated in the intertransverse spaces between L4 and L5 and the sacral ala was packed with the remainder of the bone which totaled 9 cc on each side of bone from L4 to the sacrum.  Once the bone graft was placed the system was tightened in a neutral construct final radiographs were obtained in AP and lateral projection.  Then 25 cc of half percent Marcaine was injected into the paraspinous fascia.  The lumbodorsal fascia was then closed with #1 Vicryl in interrupted fashion 2-0 Vicryl was used in subcutaneous tissues 3-0 Vicryl and 4-0 Vicryl subcuticularly.  Blood loss for the procedure was 750 cc 220 cc of Cell Saver blood was returned to the patient.

## 2023-07-24 NOTE — Progress Notes (Signed)
Orthopedic Tech Progress Note Patient Details:  Shirley Orozco 07-May-1951 [id number]  Ortho Devices Type of Ortho Device: Lumbar corsett Ortho Device/Splint Interventions: Ordered   Post Interventions Instructions Provided: Care of device, Adjustment of device  [place] A Gerilyn Pilgrim 07/24/2023, 8:32 PM

## 2023-07-24 NOTE — Addendum Note (Signed)
Addendum  created 07/24/23 1628 by Sandie Ano, CRNA   Intraprocedure Meds edited

## 2023-07-24 NOTE — Transfer of Care (Signed)
Immediate Anesthesia Transfer of Care Note  Patient: Shirley Orozco  Procedure(s) Performed: Posterior Lumbar Interbody Fusion - Lumbar Four-Lumbar Five - Lumbar Five-Sacral One - Posterior Lateral and Interbody Fusion with Augmentation (Spine Lumbar)  Patient Location: PACU  Anesthesia Type:General  Level of Consciousness: awake and drowsy  Airway & Oxygen Therapy: Patient Spontanous Breathing and Patient connected to nasal cannula oxygen  Post-op Assessment: Report given to RN and Post -op Vital signs reviewed and stable  Post vital signs: Reviewed and stable  Last Vitals:  Vitals Value Taken Time  BP 94/53 07/24/23 1321  Temp    Pulse 82 07/24/23 1323  Resp 19 07/24/23 1323  SpO2 98 % 07/24/23 1323  Vitals shown include unfiled device data.  Last Pain:  Vitals:   07/24/23 0617  TempSrc: Oral  PainSc: 6          Complications: No notable events documented.

## 2023-07-24 NOTE — Anesthesia Postprocedure Evaluation (Signed)
Anesthesia Post Note  Patient: Shirley Orozco  Procedure(s) Performed: Posterior Lumbar Interbody Fusion - Lumbar Four-Lumbar Five - Lumbar Five-Sacral One - Posterior Lateral and Interbody Fusion with Augmentation (Spine Lumbar)     Patient location during evaluation: PACU Anesthesia Type: General Level of consciousness: sedated, patient cooperative and oriented Pain management: pain level controlled Vital Signs Assessment: post-procedure vital signs reviewed and stable Respiratory status: spontaneous breathing, nonlabored ventilation, respiratory function stable and patient connected to nasal cannula oxygen Cardiovascular status: blood pressure returned to baseline and stable Postop Assessment: no apparent nausea or vomiting Anesthetic complications: no   No notable events documented.  Last Vitals:  Vitals:   07/24/23 1415 07/24/23 1430  BP: (!) 103/51 (!) 96/52  Pulse: 75 77  Resp: 13 14  Temp:    SpO2: 98% 90%    Last Pain:  Vitals:   07/24/23 1415  TempSrc:   PainSc: 5                  Gwyn Hieronymus,E. Chelisa Hennen

## 2023-07-24 NOTE — H&P (Signed)
Shirley Orozco is an 72 y.o. female.   Chief Complaint: Bilateral lower extremity pain left worse than right HPI: Shirley Orozco is a 72 year old individual who has had a previous disc herniation operated on the little over a year ago.  She initially got some good relief but then started having substantial pain that was not getting better and over time it was noted that she had developed advanced degenerative changes not only at L5-S1 where she had a disc herniation but also at L4-L5 with some lateral recess stenosis facet hypertrophy and a right-sided facet cyst.  Was creating significant pressure on the exiting L5 nerve root below.  He was advised that should she undergo surgical intervention it would require both decompression arthrodesis at L4-5 and L5-S1.  Past Medical History:  Diagnosis Date   Arthritis    Complication of anesthesia    Diabetes mellitus    Hyperlipidemia    Hypertension    Obesity    Personal history of other genital system and obstetric disorders(V13.29)    Pneumonia    1990s   PONV (postoperative nausea and vomiting)    PUD (peptic ulcer disease)     Past Surgical History:  Procedure Laterality Date   ABDOMINAL HYSTERECTOMY     1990s. Per pt, she was told no GYN surgeries due to increased scar tissue   CARPAL TUNNEL RELEASE Right    KNEE ARTHROSCOPY Right    NECK SURGERY     1990s. Cervical Spine   OOPHORECTOMY     TONSILLECTOMY     As a child   TOOTH EXTRACTION     TUBAL LIGATION      Family History  Problem Relation Age of Onset   Hypertension Father    Diabetes Sister    Hypertension Sister    Heart attack Brother    Hypertension Brother    Diabetes Maternal Aunt    Heart attack Maternal Aunt    Hypertension Maternal Aunt    Heart attack Maternal Grandmother    Social History:  reports that she has never smoked. She has never used smokeless tobacco. She reports that she does not currently use alcohol. She reports that she does not use  drugs.  Allergies:  Allergies  Allergen Reactions   Potassium-Containing Compounds Rash   Sulfa Antibiotics     cramps   Topamax [Topiramate] Nausea Only   Klor-Con [Potassium Chloride Er] Rash    Capsule only   Spironolactone Itching and Rash    With increased dose 50 mg   Headaches    Facility-Administered Medications Prior to Admission  Medication Dose Route Frequency Provider Last Rate Last Admin   betamethasone acetate-betamethasone sodium phosphate (CELESTONE) injection 3 mg  3 mg Intra-articular Once Felecia Shelling, DPM       Medications Prior to Admission  Medication Sig Dispense Refill   amLODipine (NORVASC) 5 MG tablet Take 5 mg by mouth daily.     gabapentin (NEURONTIN) 300 MG capsule Take 300 mg by mouth in the morning.     metFORMIN (GLUCOPHAGE) 500 MG tablet Take 500 mg by mouth daily.     spironolactone (ALDACTONE) 25 MG tablet Take 25 mg by mouth 2 (two) times daily.     telmisartan (MICARDIS) 40 MG tablet Take 40 mg by mouth daily.     meloxicam (MOBIC) 15 MG tablet Take 1 tablet (15 mg total) by mouth daily. (Patient not taking: Reported on 07/10/2023) 30 tablet 1   methylPREDNISolone (MEDROL DOSEPAK) 4 MG  TBPK tablet 6 day dose pack - take as directed (Patient not taking: Reported on 07/10/2023) 21 tablet 0   Triamcinolone Acetonide (TRIAMCINOLONE 0.1 % CREAM : EUCERIN) CREA Apply 1 application topically 3 (three) times daily as needed. (Patient not taking: Reported on 07/10/2023) 1 each 1    Results for orders placed or performed during the hospital encounter of 07/24/23 (from the past 48 hour(s))  Glucose, capillary     Status: Abnormal   Collection Time: 07/24/23  6:11 AM  Result Value Ref Range   Glucose-Capillary 112 (H) 70 - 99 mg/dL    Comment: Glucose reference range applies only to samples taken after fasting for at least 8 hours.  ABO/Rh     Status: None (Preliminary result)   Collection Time: 07/24/23  7:15 AM  Result Value Ref Range   ABO/RH(D)  PENDING    No results found.  Review of Systems  Musculoskeletal:  Positive for back pain, gait problem and myalgias.  Neurological:  Positive for weakness and numbness.  All other systems reviewed and are negative.   Blood pressure (!) 144/72, pulse 72, temperature 98.2 F (36.8 C), temperature source Oral, resp. rate 17, height 5\' 2"  (1.575 m), weight 83.1 kg, SpO2 98%. Physical Exam Constitutional:      Appearance: Normal appearance.  HENT:     Head: Normocephalic and atraumatic.     Right Ear: Tympanic membrane, ear canal and external ear normal.     Left Ear: Tympanic membrane, ear canal and external ear normal.     Nose: Nose normal.     Mouth/Throat:     Mouth: Mucous membranes are moist.     Pharynx: Oropharynx is clear.  Eyes:     Extraocular Movements: Extraocular movements intact.     Conjunctiva/sclera: Conjunctivae normal.     Pupils: Pupils are equal, round, and reactive to light.  Cardiovascular:     Rate and Rhythm: Normal rate and regular rhythm.     Pulses: Normal pulses.     Heart sounds: Normal heart sounds.  Pulmonary:     Effort: Pulmonary effort is normal.     Breath sounds: Normal breath sounds.  Abdominal:     General: Abdomen is flat. Bowel sounds are normal.     Palpations: Abdomen is soft.  Musculoskeletal:        General: Normal range of motion.     Cervical back: Normal range of motion.  Skin:    General: Skin is warm and dry.     Capillary Refill: Capillary refill takes less than 2 seconds.  Neurological:     Mental Status: She is alert.     Comments: This weakness in the tibialis anterior 4 out of 5 bilaterally gastroc strength is decreased to 4 out of 5 bilaterally.  Absent reflexes in the patellae and the Achilles.  Cranial nerve examination and upper motor strength is normal.  Psychiatric:        Mood and Affect: Mood normal.        Behavior: Behavior normal.        Thought Content: Thought content normal.        Judgment: Judgment  normal.      Assessment/Plan Spondylosis and stenosis L4-5 and L5-S1 with lumbar radiculopathy.  History of herniated nucleus pulposus L5-S1.  Plan: Posterior lumbar interbody arthrodesis and decompression L4-5 and L5-S1.  Pedicle screw fixation L4 to sacrum.  Methacrylate augmentation.  Stefani Dama, MD 07/24/2023, 7:40 AM

## 2023-07-24 NOTE — Anesthesia Procedure Notes (Signed)
Procedure Name: Intubation Date/Time: 07/24/2023 8:12 AM  Performed by: Sandie Ano, CRNAPre-anesthesia Checklist: Patient identified, Emergency Drugs available, Suction available and Patient being monitored Patient Re-evaluated:Patient Re-evaluated prior to induction Oxygen Delivery Method: Circle System Utilized Preoxygenation: Pre-oxygenation with 100% oxygen Induction Type: IV induction Ventilation: Mask ventilation without difficulty Laryngoscope Size: Mac and 3 Grade View: Grade I Tube type: Oral Tube size: 7.0 mm Number of attempts: 1 Airway Equipment and Method: Stylet and Oral airway Placement Confirmation: ETT inserted through vocal cords under direct vision, positive ETCO2 and breath sounds checked- equal and bilateral Secured at: 22 cm Tube secured with: Tape Dental Injury: Teeth and Oropharynx as per pre-operative assessment

## 2023-07-25 LAB — GLUCOSE, CAPILLARY
Glucose-Capillary: 99 mg/dL (ref 70–99)
Glucose-Capillary: 87 mg/dL (ref 70–99)
Glucose-Capillary: 94 mg/dL (ref 70–99)
Glucose-Capillary: 76 mg/dL (ref 70–99)

## 2023-07-25 LAB — CBC
HCT: 31.6 % — ABNORMAL LOW (ref 36.0–46.0)
Hemoglobin: 9.8 g/dL — ABNORMAL LOW (ref 12.0–15.0)
MCH: 28.4 pg (ref 26.0–34.0)
MCHC: 31 g/dL (ref 30.0–36.0)
MCV: 91.6 fL (ref 80.0–100.0)
Platelets: 184 10*3/uL (ref 150–400)
RBC: 3.45 MIL/uL — ABNORMAL LOW (ref 3.87–5.11)
RDW: 14.1 % (ref 11.5–15.5)
WBC: 11.6 10*3/uL — ABNORMAL HIGH (ref 4.0–10.5)
nRBC: 0 % (ref 0.0–0.2)

## 2023-07-25 LAB — BASIC METABOLIC PANEL
Anion gap: 8 (ref 5–15)
BUN: 24 mg/dL — ABNORMAL HIGH (ref 8–23)
CO2: 25 mmol/L (ref 22–32)
Calcium: 8.6 mg/dL — ABNORMAL LOW (ref 8.9–10.3)
Chloride: 103 mmol/L (ref 98–111)
Creatinine, Ser: 1.68 mg/dL — ABNORMAL HIGH (ref 0.44–1.00)
GFR, Estimated: 32 mL/min — ABNORMAL LOW (ref >60)
Glucose, Bld: 111 mg/dL — ABNORMAL HIGH (ref 70–99)
Potassium: 4.2 mmol/L (ref 3.5–5.1)
Sodium: 136 mmol/L (ref 135–145)

## 2023-07-25 MED ORDER — GABAPENTIN 300 MG PO CAPS
300.0000 mg | ORAL_CAPSULE | Freq: Every morning | ORAL | Status: DC
  Administered 2023-07-26: 300 mg via ORAL
  Filled 2023-07-25: qty 1

## 2023-07-25 MED ORDER — METFORMIN HCL 500 MG PO TABS
500.0000 mg | ORAL_TABLET | Freq: Every day | ORAL | Status: DC
  Administered 2023-07-26: 500 mg via ORAL
  Filled 2023-07-25: qty 1

## 2023-07-25 MED ORDER — IRBESARTAN 150 MG PO TABS
150.0000 mg | ORAL_TABLET | Freq: Every day | ORAL | Status: DC
  Administered 2023-07-26 – 2023-07-27 (×2): 150 mg via ORAL
  Filled 2023-07-25 (×2): qty 1

## 2023-07-25 MED ORDER — HYDROCODONE-ACETAMINOPHEN 5-325 MG PO TABS
1.0000 | ORAL_TABLET | ORAL | Status: DC | PRN
  Administered 2023-07-25 – 2023-07-26 (×4): 2 via ORAL
  Administered 2023-07-27: 1 via ORAL
  Filled 2023-07-25 (×5): qty 2

## 2023-07-25 MED ORDER — AMLODIPINE BESYLATE 5 MG PO TABS
5.0000 mg | ORAL_TABLET | Freq: Every day | ORAL | Status: DC
  Administered 2023-07-26: 5 mg via ORAL
  Filled 2023-07-25: qty 1

## 2023-07-25 MED ORDER — CYCLOBENZAPRINE HCL 5 MG PO TABS
5.0000 mg | ORAL_TABLET | Freq: Three times a day (TID) | ORAL | Status: DC | PRN
  Filled 2023-07-25: qty 1

## 2023-07-25 MED FILL — Thrombin For Soln 5000 Unit: CUTANEOUS | Qty: 5000 | Status: AC

## 2023-07-25 NOTE — Evaluation (Signed)
Occupational Therapy Evaluation Patient Details Name: Genessis Fielder MRN: [id number] DOB: 09-07-1951 Today's Date: 07/25/2023   History of Present Illness Ms. Jameyah Coones is a 72 year old who underwent decompression arthrodesis at L4-5 and L5-S1 9/24. PMHx: arthritis, DM, HLD< HTN, PNA, PUD   Clinical Impression   Sylver was evaluated s/p the above admission list. She reports being indep at baseline, and has 1 recent fall at the doctors office. Upon evaluation the pt was limited by back pain, spinal precautions, weakness and poor activity tolerance. Overall she needed CGA with cues for all aspects of mobility; pt benefits from RW for safety. Due to the deficits listed below the pt also needs up to mod A for LB ADLs as she is unable to get into figure four position, she may benefit from AE education to increase indep at home. Pt will benefit from continued acute OT services and HHOT.        If plan is discharge home, recommend the following: A little help with walking and/or transfers;A lot of help with bathing/dressing/bathroom;Assistance with cooking/housework;Assist for transportation;Help with stairs or ramp for entrance    Functional Status Assessment  Patient has had a recent decline in their functional status and demonstrates the ability to make significant improvements in function in a reasonable and predictable amount of time.  Equipment Recommendations  BSC/3in1;Other (comment) (RW)       Precautions / Restrictions Precautions Precautions: Fall;Back Precaution Booklet Issued: Yes (comment) Required Braces or Orthoses: Spinal Brace Spinal Brace: Applied in sitting position Restrictions Weight Bearing Restrictions: No      Mobility Bed Mobility Overal bed mobility: Needs Assistance Bed Mobility: Rolling, Sidelying to Sit Rolling: Contact guard assist Sidelying to sit: Contact guard assist       General bed mobility comments: increased time, cues for log roll     Transfers Overall transfer level: Needs assistance Equipment used: Rolling walker (2 wheels) Transfers: Sit to/from Stand Sit to Stand: Contact guard assist           General transfer comment: From bed, toilet and recliner      Balance Overall balance assessment: Needs assistance Sitting-balance support: Feet supported Sitting balance-Leahy Scale: Good     Standing balance support: Single extremity supported, During functional activity Standing balance-Leahy Scale: Fair Standing balance comment: statically at the sink                           ADL either performed or assessed with clinical judgement   ADL Overall ADL's : Needs assistance/impaired Eating/Feeding: Independent   Grooming: Supervision/safety   Upper Body Bathing: Set up;Cueing for safety   Lower Body Bathing: Moderate assistance;Cueing for safety;Cueing for compensatory techniques;Cueing for back precautions   Upper Body Dressing : Set up;Sitting   Lower Body Dressing: Moderate assistance;Cueing for safety;Cueing for sequencing;Cueing for compensatory techniques;Cueing for back precautions   Toilet Transfer: Contact guard assist;Ambulation;Rolling walker (2 wheels);Regular Toilet   Toileting- Clothing Manipulation and Hygiene: Supervision/safety;Sitting/lateral lean       Functional mobility during ADLs: Contact guard assist General ADL Comments: pt unable to get into figure 4 position, maximal cues needed to follow spinal precautions throughout     Vision Baseline Vision/History: 0 No visual deficits Vision Assessment?: No apparent visual deficits     Perception Perception: Within Functional Limits       Praxis Praxis: WFL       Pertinent Vitals/Pain Pain Assessment Pain Assessment: Faces Faces Pain Scale: Hurts even  more Pain Location: back Pain Descriptors / Indicators: Discomfort Pain Intervention(s): Limited activity within patient's tolerance, Monitored during session      Extremity/Trunk Assessment Upper Extremity Assessment Upper Extremity Assessment: Overall WFL for tasks assessed   Lower Extremity Assessment Lower Extremity Assessment: Defer to PT evaluation   Cervical / Trunk Assessment Cervical / Trunk Assessment: Back Surgery   Communication Communication Communication: No apparent difficulties   Cognition Arousal: Alert Behavior During Therapy: WFL for tasks assessed/performed Overall Cognitive Status: Impaired/Different from baseline Area of Impairment: Following commands, Safety/judgement, Awareness, Memory                     Memory: Decreased short-term memory, Decreased recall of precautions Following Commands: Follows one step commands consistently Safety/Judgement: Decreased awareness of safety, Decreased awareness of deficits Awareness: Emergent   General Comments: Needed significant cues fro back precautions, has limited insight to safety deficits and problem solving. Unable to recall restrictions after review     General Comments  VSS, pt reported mild dizziness at the end of the session     Home Living Family/patient expects to be discharged to:: Private residence Living Arrangements: Spouse/significant other Available Help at Discharge: Family;Available PRN/intermittently Type of Home: Apartment Home Access: Stairs to enter Entrance Stairs-Number of Steps: 1 Entrance Stairs-Rails: None Home Layout: One level     Bathroom Shower/Tub: Chief Strategy Officer: Standard     Home Equipment: Cane - single point          Prior Functioning/Environment Prior Level of Function : Independent/Modified Independent;Driving             Mobility Comments: 1 recent fall at MD office ADLs Comments: indep, drives        OT Problem List: Decreased strength;Decreased range of motion;Impaired balance (sitting and/or standing);Decreased activity tolerance;Decreased safety awareness;Decreased  knowledge of use of DME or AE;Decreased knowledge of precautions;Decreased cognition;Pain      OT Treatment/Interventions: Self-care/ADL training;Therapeutic exercise;DME and/or AE instruction;Therapeutic activities;Balance training;Patient/family education    OT Goals(Current goals can be found in the care plan section) Acute Rehab OT Goals Patient Stated Goal: to stay another night OT Goal Formulation: With patient Time For Goal Achievement: 08/08/23 Potential to Achieve Goals: Good ADL Goals Pt Will Perform Upper Body Dressing: with modified independence Pt Will Perform Lower Body Dressing: with min assist Additional ADL Goal #1: Pt will indep recall 3/3 spinal precautions  OT Frequency: Min 1X/week       AM-PAC OT "6 Clicks" Daily Activity     Outcome Measure Help from another person eating meals?: None Help from another person taking care of personal grooming?: A Little Help from another person toileting, which includes using toliet, bedpan, or urinal?: A Little Help from another person bathing (including washing, rinsing, drying)?: A Lot Help from another person to put on and taking off regular upper body clothing?: A Little Help from another person to put on and taking off regular lower body clothing?: A Lot 6 Click Score: 17   End of Session Equipment Utilized During Treatment: Rolling walker (2 wheels) Nurse Communication: Mobility status  Activity Tolerance: Patient tolerated treatment well Patient left: in chair;with call bell/phone within reach  OT Visit Diagnosis: Unsteadiness on feet (R26.81);Other abnormalities of gait and mobility (R26.89);Muscle weakness (generalized) (M62.81);History of falling (Z91.81);Pain                Time: 5188-4166 OT Time Calculation (min): 34 min Charges:  OT General Charges $OT Visit:  1 Visit OT Evaluation $OT Eval Moderate Complexity: 1 Mod OT Treatments $Self Care/Home Management : 8-22 mins  Derenda Mis, OTR/L Acute  Rehabilitation Services Office [phone] Secure Chat Communication Preferred   Donia Pounds 07/25/2023, 9:09 AM

## 2023-07-25 NOTE — TOC Initial Note (Signed)
Transition of Care [facility]) - Initial/Assessment Note    Patient Details  Name: Shirley Orozco MRN: [id number] Date of Birth: [birth year]  Transition of Care [facility]) CM/SW Contact:    Kermit Balo, RN Phone Number: 07/25/2023, 4:09 PM  Clinical Narrative:                  Patient is from home with her SO. Plan is to d/c home with home health service through [facility]. Information on the AVS.  Bedside RN will obtain needed DME for home. Pt has transportation home when discharged.    Expected Discharge Plan: Home w Home Health Services Barriers to Discharge: Continued Medical Work up   Patient Goals and CMS Choice   CMS Medicare.gov Compare Post Acute Care list provided to:: Patient Choice offered to / list presented to : Patient      Expected Discharge Plan and Services   Discharge Planning Services: CM Consult Post Acute Care Choice: Home Health Living arrangements for the past 2 months: Apartment                           HH Arranged: PT, OT HH Agency: [facility] Date [facility] Agency Contacted: 07/25/23   Representative spoke with at [facility] Agency: Tresa Endo  Prior Living Arrangements/Services Living arrangements for the past 2 months: Apartment Lives with:: Significant Other Patient language and need for interpreter reviewed:: Yes Do you feel safe going back to the place where you live?: Yes        Care giver support system in place?: Yes (comment)   Criminal Activity/Legal Involvement Pertinent to Current Situation/Hospitalization: No - Comment as needed  Activities of Daily Living      Permission Sought/Granted                  Emotional Assessment Appearance:: Appears stated age Attitude/Demeanor/Rapport: Engaged Affect (typically observed): Accepting Orientation: : Oriented to Self, Oriented to Place, Oriented to  Time, Oriented to Situation   Psych Involvement: No (comment)  Admission diagnosis:  Spinal stenosis of lumbar region with  radiculopathy [M48.061, M54.16] Patient Active Problem List   Diagnosis Date Noted   Spinal stenosis of lumbar region with radiculopathy 07/24/2023   Drug-induced skin rash 05/07/2015   Pain in right foot 05/07/2015   Plantar fasciitis of right foot 11/17/2014   Pronation deformity of ankle, acquired 11/17/2014   Equinus deformity of foot, acquired 11/17/2014   Dizziness and giddiness 01/15/2014   Headache(784.0) 01/15/2014   Neck pain 01/26/2011   Low back pain 01/26/2011   PCP:  Farris Has, MD Pharmacy:   [facility] [id number] [place], [place] - [address] AT [address] [facility] [address] [place] [address] Phone: [phone] Fax: [phone]     Social Determinants of Health (SDOH) Social History: SDOH Screenings   Tobacco Use: Low Risk  (07/24/2023)   SDOH Interventions:     Readmission Risk Interventions     No data to display

## 2023-07-25 NOTE — Evaluation (Signed)
Physical Therapy Evaluation  Patient Details Name: Shirley Orozco MRN: [id number] DOB: 1951/08/16 Today's Date: 07/25/2023  History of Present Illness  [name] is a 72 year old who underwent decompression arthrodesis at L4-5 and L5-S1 9/24. PMH significant for arthritis, DM, HTN, PNA, PUD   Clinical Impression  Pt admitted with above diagnosis. At the time of PT eval, pt was able to demonstrate transfers and ambulation with gross CGA and RW for support. VSS throughout session however pt reports feeling unable to catch her breath during gait training. Pt without obvious signs of increased work of breathing. Pt was educated on precautions, brace application/wearing schedule, appropriate activity progression, and car transfer. Pt currently with functional limitations due to the deficits listed below (see PT Problem List). Pt will benefit from skilled PT to increase their independence and safety with mobility to allow discharge to the venue listed below.          If plan is discharge home, recommend the following: A little help with walking and/or transfers;A little help with bathing/dressing/bathroom;Assistance with cooking/housework;Help with stairs or ramp for entrance;Assist for transportation   Can travel by private vehicle        Equipment Recommendations Rolling walker (2 wheels) (youth)  Recommendations for Other Services       Functional Status Assessment Patient has had a recent decline in their functional status and demonstrates the ability to make significant improvements in function in a reasonable and predictable amount of time.     Precautions / Restrictions Precautions Precautions: Fall;Back Precaution Booklet Issued: Yes (comment) Precaution Comments: Reviewed handout and pt was cued for precautions during functional mobility. Required Braces or Orthoses: Spinal Brace Spinal Brace: Lumbar corset;Applied in sitting position Restrictions Weight Bearing  Restrictions: No      Mobility  Bed Mobility               General bed mobility comments: Pt was received sitting up in the recliner at this time.    Transfers Overall transfer level: Needs assistance Equipment used: Rolling walker (2 wheels) Transfers: Sit to/from Stand Sit to Stand: Contact guard assist           General transfer comment: Hands on guarding for safety as pt powered up to full stand. VC's for hand placement on seated surface and for controlled lower down to sitting on EOB.    Ambulation/Gait Ambulation/Gait assistance: Contact guard assist Gait Distance (Feet): 150 Feet Assistive device: Rolling walker (2 wheels) Gait Pattern/deviations: Step-through pattern, Decreased stride length, Trunk flexed, Narrow base of support Gait velocity: Decreased Gait velocity interpretation: <1.31 ft/sec, indicative of household ambulator   General Gait Details: Slow and guarded. Appeared generally steady with RW for support however did require hands on guarding for safety throughout. Noted pt reports dizziness with turns and states she is having trouble catching her breath. Pt exhibiting a loud sound with breathing that was not, but resembled, a wheeze for a short period of time. BP 123/51 and O2 94% on RA upon return to the room.  Stairs            Wheelchair Mobility     Tilt Bed    Modified Rankin (Stroke Patients Only)       Balance Overall balance assessment: Needs assistance Sitting-balance support: Feet supported Sitting balance-Leahy Scale: Good     Standing balance support: Single extremity supported, During functional activity Standing balance-Leahy Scale: Fair Standing balance comment: statically at the sink  Pertinent Vitals/Pain Pain Assessment Pain Assessment: Faces Faces Pain Scale: Hurts even more Pain Location: back Pain Descriptors / Indicators: Discomfort Pain Intervention(s): Limited  activity within patient's tolerance, Monitored during session, Repositioned    Home Living Family/patient expects to be discharged to:: Private residence Living Arrangements: Spouse/significant other Available Help at Discharge: Family;Available PRN/intermittently Type of Home: Apartment Home Access: Stairs to enter Entrance Stairs-Rails: None Entrance Stairs-Number of Steps: 1   Home Layout: One level Home Equipment: Cane - single point      Prior Function Prior Level of Function : Independent/Modified Independent;Driving             Mobility Comments: 1 recent fall at MD office ADLs Comments: indep, drives     Extremity/Trunk Assessment   Upper Extremity Assessment Upper Extremity Assessment: Defer to OT evaluation    Lower Extremity Assessment Lower Extremity Assessment: Generalized weakness    Cervical / Trunk Assessment Cervical / Trunk Assessment: Back Surgery  Communication   Communication Communication: No apparent difficulties  Cognition Arousal: Alert Behavior During Therapy: WFL for tasks assessed/performed Overall Cognitive Status: Impaired/Different from baseline Area of Impairment: Following commands, Safety/judgement, Awareness, Memory                     Memory: Decreased short-term memory, Decreased recall of precautions Following Commands: Follows one step commands consistently Safety/Judgement: Decreased awareness of safety, Decreased awareness of deficits Awareness: Emergent   General Comments: Needed significant cues for back precautions, has limited insight to safety deficits and problem solving.        General Comments      Exercises     Assessment/Plan    PT Assessment Patient needs continued PT services  PT Problem List Decreased strength;Decreased activity tolerance;Decreased mobility;Decreased balance;Decreased knowledge of use of DME;Decreased safety awareness;Decreased knowledge of precautions;Pain       PT  Treatment Interventions DME instruction;Gait training;Stair training;Functional mobility training;Therapeutic activities;Therapeutic exercise;Balance training;Patient/family education    PT Goals (Current goals can be found in the Care Plan section)  Acute Rehab PT Goals Patient Stated Goal: Return home tomorrow PT Goal Formulation: With patient Time For Goal Achievement: 08/01/23 Potential to Achieve Goals: Good    Frequency Min 1X/week     Co-evaluation               AM-PAC PT "6 Clicks" Mobility  Outcome Measure Help needed turning from your back to your side while in a flat bed without using bedrails?: A Little Help needed moving from lying on your back to sitting on the side of a flat bed without using bedrails?: A Little Help needed moving to and from a bed to a chair (including a wheelchair)?: A Little Help needed standing up from a chair using your arms (e.g., wheelchair or bedside chair)?: A Little Help needed to walk in hospital room?: A Little Help needed climbing 3-5 steps with a railing? : A Little 6 Click Score: 18    End of Session Equipment Utilized During Treatment: Gait belt Activity Tolerance: Patient tolerated treatment well Patient left: with call bell/phone within reach;Other (comment) (Sitting in bathroom with nurse's call cord in hand; NT aware) Nurse Communication: Mobility status PT Visit Diagnosis: Unsteadiness on feet (R26.81);Pain Pain - part of body:  (back)    Time: 6045-4098 PT Time Calculation (min) (ACUTE ONLY): 24 min   Charges:   PT Evaluation $PT Eval Low Complexity: 1 Low PT Treatments $Gait Training: 8-22 mins PT General Charges $$ ACUTE PT VISIT:  1 Visit         Conni Slipper, PT, DPT Acute Rehabilitation Services Secure Chat Preferred Office: [phone]   Marylynn Pearson 07/25/2023, 2:18 PM

## 2023-07-26 LAB — GLUCOSE, CAPILLARY
Glucose-Capillary: 105 mg/dL — ABNORMAL HIGH (ref 70–99)
Glucose-Capillary: 114 mg/dL — ABNORMAL HIGH (ref 70–99)
Glucose-Capillary: 112 mg/dL — ABNORMAL HIGH (ref 70–99)

## 2023-07-26 MED ORDER — INSULIN ASPART 100 UNIT/ML IJ SOLN: 0.0000 [IU] | Freq: Three times a day (TID) | INTRAMUSCULAR | Status: DC

## 2023-07-26 MED ORDER — HYDROCODONE-ACETAMINOPHEN 5-325 MG PO TABS: 1.0000 | ORAL_TABLET | ORAL | 0 refills | Status: AC | PRN

## 2023-07-26 MED ORDER — CYCLOBENZAPRINE HCL 5 MG PO TABS: 5.0000 mg | ORAL_TABLET | Freq: Three times a day (TID) | ORAL | 3 refills | Status: AC | PRN

## 2023-07-26 NOTE — Progress Notes (Signed)
Patient ID: Shirley Orozco, female   DOB: 1951/05/26, 72 y.o.   MRN: [id number] Vital signs are stable Patient is ambulating better Incision is clean and dry She wishes to stay until tomorrow that she will have further help at home I believe that we can accommodate this Plan discharge for tomorrow morning with home health aide for physical therapy occupational therapy.

## 2023-07-26 NOTE — Progress Notes (Signed)
Physical Therapy Treatment  Patient Details Name: Shirley Orozco MRN: [id number] DOB: [birth year] Today's Date: 07/26/2023   History of Present Illness Shirley Orozco is a 72 year old who underwent decompression arthrodesis at L4-5 and L5-S1 9/24. PMH significant for arthritis, DM, HTN, PNA, PUD    PT Comments  Pt progressing well with post-op mobility. She was able to demonstrate transfers and ambulation with gross min guard assist to supervision for safety and RW for support. Pt utilized SPC on the stairs. Pt was educated on precautions, brace application/wearing schedule, appropriate activity progression, and car transfer. Will continue to follow.      If plan is discharge home, recommend the following: A little help with walking and/or transfers;A little help with bathing/dressing/bathroom;Assistance with cooking/housework;Help with stairs or ramp for entrance;Assist for transportation   Can travel by private vehicle        Equipment Recommendations  Rolling walker (2 wheels) (youth)    Recommendations for Other Services       Precautions / Restrictions Precautions Precautions: Fall;Back Precaution Booklet Issued: Yes (comment) Precaution Comments: Reviewed handout and pt was cued for precautions during functional mobility. Required Braces or Orthoses: Spinal Brace Spinal Brace: Lumbar corset;Applied in sitting position Restrictions Weight Bearing Restrictions: No     Mobility  Bed Mobility               General bed mobility comments: Pt was received sitting up in recliner with trunk elevated and LE's out on leg rest; body in "L" position. Educated on optimal positioning recommendations.    Transfers Overall transfer level: Needs assistance Equipment used: Rolling walker (2 wheels) Transfers: Sit to/from Stand Sit to Stand: Contact guard assist           General transfer comment: VC's for hand placement on seated surface for safety. No assist required but  close guard provided for safety.    Ambulation/Gait Ambulation/Gait assistance: Contact guard assist Gait Distance (Feet): 200 Feet Assistive device: Rolling walker (2 wheels) Gait Pattern/deviations: Step-through pattern, Decreased stride length, Trunk flexed, Narrow base of support Gait velocity: Decreased Gait velocity interpretation: <1.31 ft/sec, indicative of household ambulator   General Gait Details: Slow and guarded. Appeared generally steady with RW for support however did require hands on guarding for safety throughout. Noted pt reports dizziness with turns and states she is having trouble catching her breath. Pt exhibiting a loud sound with breathing that was not, but resembled, a wheeze for a short period of time. BP 123/51 and O2 94% on RA upon return to the room.   Stairs Stairs: Yes Stairs assistance: Min assist Stair Management: One rail Right, With cane, Step to pattern Number of Stairs: 10 General stair comments: VC's for sequencing and general safety.   Wheelchair Mobility     Tilt Bed    Modified Rankin (Stroke Patients Only)       Balance Overall balance assessment: Needs assistance Sitting-balance support: Feet supported Sitting balance-Leahy Scale: Good     Standing balance support: Single extremity supported, During functional activity Standing balance-Leahy Scale: Fair Standing balance comment: statically at the sink                            Cognition Arousal: Alert Behavior During Therapy: WFL for tasks assessed/performed Overall Cognitive Status: Impaired/Different from baseline Area of Impairment: Memory, Awareness, Safety/judgement  Memory: Decreased recall of precautions, Decreased short-term memory Following Commands:  (following all on step commands; does benefit from one step commands during new learning) Safety/Judgement: Decreased awareness of safety, Decreased awareness of  deficits Awareness: Emergent            Exercises      General Comments        Pertinent Vitals/Pain Pain Assessment Pain Assessment: Faces Faces Pain Scale: Hurts little more Pain Location: back Pain Descriptors / Indicators: Discomfort Pain Intervention(s): Limited activity within patient's tolerance, Monitored during session, Repositioned    Home Living                          Prior Function            PT Goals (current goals can now be found in the care plan section) Acute Rehab PT Goals Patient Stated Goal: Return home tomorrow PT Goal Formulation: With patient Time For Goal Achievement: 08/01/23 Potential to Achieve Goals: Good Progress towards PT goals: Progressing toward goals    Frequency    Min 1X/week      PT Plan      Co-evaluation              AM-PAC PT "6 Clicks" Mobility   Outcome Measure  Help needed turning from your back to your side while in a flat bed without using bedrails?: A Little Help needed moving from lying on your back to sitting on the side of a flat bed without using bedrails?: A Little Help needed moving to and from a bed to a chair (including a wheelchair)?: A Little Help needed standing up from a chair using your arms (e.g., wheelchair or bedside chair)?: A Little Help needed to walk in hospital room?: A Little Help needed climbing 3-5 steps with a railing? : A Little 6 Click Score: 18    End of Session Equipment Utilized During Treatment: Gait belt Activity Tolerance: Patient tolerated treatment well Patient left: in chair;with call bell/phone within reach Nurse Communication: Mobility status PT Visit Diagnosis: Unsteadiness on feet (R26.81);Pain Pain - part of body:  (back)     Time: 9528-4132 PT Time Calculation (min) (ACUTE ONLY): 31 min  Charges:    $Gait Training: 23-37 mins PT General Charges $$ ACUTE PT VISIT: 1 Visit                     Shirley Orozco, PT, DPT Acute  Rehabilitation Services Secure Chat Preferred Office: [phone]    Shirley Orozco 07/26/2023, 2:25 PM  Shirley Orozco, PT, DPT Acute Rehabilitation Services Secure Chat Preferred Office: [phone]

## 2023-07-26 NOTE — Discharge Summary (Addendum)
Physician Discharge Summary  Patient ID: Shirley Orozco MRN: [id number] DOB/AGE: June 01, 1951 72 y.o.  Admit date: 07/24/2023 Discharge date: 07/27/2023  Admission Diagnoses: Lumbar stenosis with radiculopathy L4-5 and L5-S1  Discharge Diagnoses: Lumbar stenosis with radiculopathy L4-5 L5-S1.  Neurogenic claudication. Principal Problem:   Spinal stenosis of lumbar region with radiculopathy   Discharged Condition: good  Hospital Course: Patient was admitted to undergo surgical decompression which she tolerated well.  Consults: None  Significant Diagnostic Studies: None  Treatments: surgery: See op note  Discharge Exam: Blood pressure 109/86, pulse 90, temperature 99.4 F (37.4 C), temperature source Oral, resp. rate 20, height 5\' 2"  (1.575 m), weight 83.1 kg, SpO2 100%. Incision is clean and dry Station and gait are intact.  Disposition: Discharge disposition: 01-Home or Self Care       Discharge Instructions     Call MD for:  redness, tenderness, or signs of infection (pain, swelling, redness, odor or green/yellow discharge around incision site)   Complete by: As directed    Call MD for:  severe uncontrolled pain   Complete by: As directed    Call MD for:  temperature >100.4   Complete by: As directed    Diet - low sodium heart healthy   Complete by: As directed    Discharge instructions   Complete by: As directed    Okay to shower. Do not apply salves or appointments to incision. No heavy lifting with the upper extremities greater than 10 pounds. May resume driving when not requiring pain medication and patient feels comfortable with doing so.   Incentive spirometry RT   Complete by: As directed    Increase activity slowly   Complete by: As directed       Allergies as of 07/26/2023       Reactions   Potassium-containing Compounds Rash   Oxycodone-acetaminophen Other (See Comments)   HEADACHE   Sulfa Antibiotics    cramps   Topamax [topiramate] Nausea Only    Klor-con [potassium Chloride Er] Rash   Capsule only   Spironolactone Itching, Rash   With increased dose 50 mg  Headaches        Medication List     TAKE these medications    amLODipine 5 MG tablet Commonly known as: NORVASC Take 5 mg by mouth daily.   cyclobenzaprine 5 MG tablet Commonly known as: FLEXERIL Take 1 tablet (5 mg total) by mouth 3 (three) times daily as needed for muscle spasms.   gabapentin 300 MG capsule Commonly known as: NEURONTIN Take 300 mg by mouth in the morning.   HYDROcodone-acetaminophen 5-325 MG tablet Commonly known as: NORCO/VICODIN Take 1-2 tablets by mouth every 4 (four) hours as needed for moderate pain or severe pain.   meloxicam 15 MG tablet Commonly known as: MOBIC Take 1 tablet (15 mg total) by mouth daily.   metFORMIN 500 MG tablet Commonly known as: GLUCOPHAGE Take 500 mg by mouth daily.   methylPREDNISolone 4 MG Tbpk tablet Commonly known as: MEDROL DOSEPAK 6 day dose pack - take as directed   spironolactone 25 MG tablet Commonly known as: ALDACTONE Take 25 mg by mouth 2 (two) times daily.   telmisartan 40 MG tablet Commonly known as: MICARDIS Take 40 mg by mouth daily.   triamcinolone 0.1 % cream : eucerin Crea Apply 1 application topically 3 (three) times daily as needed.        Follow-up Information     [facility] Follow up.   Specialty: Home Health  Services Why: The home health agency will contact you for the first home visit Contact information: [address] [place] [address] [phone]                 Signed: Stefani Dama 07/26/2023, 4:45 PM

## 2023-07-26 NOTE — Progress Notes (Signed)
Occupational Therapy Treatment Patient Details Name: Shirley Orozco MRN: [id number] DOB: 09/19/1951 Today's Date: 07/26/2023   History of present illness [name] is a 72 year old who underwent decompression arthrodesis at L4-5 and L5-S1 9/24. PMH significant for arthritis, DM, HTN, PNA, PUD   OT comments  Pt progressing toward established OT goals. Continues to be limited by recall and ability to safely implement precautions on her own. Pt needing heavy cueing for precautions throughout session, and thus, continue to recommend HHOT. Max education provided regarding precautions, compensatory techniques for mobility and ADL. Pt with good demo with cues and additional education of LB ADL with compensatory techniques this session. Overall CGA during mobility and min A for LB ADL during new learning just for technique.       If plan is discharge home, recommend the following:  A little help with walking and/or transfers;A lot of help with bathing/dressing/bathroom;Assistance with cooking/housework;Assist for transportation;Help with stairs or ramp for entrance   Equipment Recommendations  BSC/3in1;Other (comment) (RW)    Recommendations for Other Services      Precautions / Restrictions Precautions Precautions: Fall;Back Precaution Booklet Issued: Yes (comment) Precaution Comments: Reviewed handout and pt was cued for precautions during functional mobility. Required Braces or Orthoses: Spinal Brace Spinal Brace: Lumbar corset;Applied in sitting position Restrictions Weight Bearing Restrictions: No       Mobility Bed Mobility               General bed mobility comments: EOB on arrival and departure. pt educated regarding bed rails she is able to purchase for her home that her partner can install easily.    Transfers Overall transfer level: Needs assistance Equipment used: Rolling walker (2 wheels) Transfers: Sit to/from Stand Sit to Stand: Contact guard assist            General transfer comment: Slow steady rise. Cues for hand placement. Poor carryover noted throughout     Balance Overall balance assessment: Needs assistance Sitting-balance support: Feet supported Sitting balance-Leahy Scale: Good                                     ADL either performed or assessed with clinical judgement   ADL Overall ADL's : Needs assistance/impaired     Grooming: Supervision/safety;Wash/dry hands Grooming Details (indicate cue type and reason): up at sink         Upper Body Dressing : Set up;Sitting   Lower Body Dressing: Minimal assistance;Sit to/from stand;Cueing for compensatory techniques;Cueing for back precautions;Cueing for sequencing (cues for sequence during new learning to use AE/compensatory techniques.) Lower Body Dressing Details (indicate cue type and reason): SIGNIFICANT time spent regarding use of AE and compensatory techniques as pt reporting she "has to bend over" to pull up her pants after going to the restroom. Increasd time and education required for pt to feel confident with this.             Functional mobility during ADLs: Contact guard assist;Rolling walker (2 wheels)      Extremity/Trunk Assessment Upper Extremity Assessment Upper Extremity Assessment: Generalized weakness   Lower Extremity Assessment Lower Extremity Assessment: Defer to PT evaluation        Vision   Vision Assessment?: No apparent visual deficits   Perception Perception Perception: Within Functional Limits   Praxis Praxis Praxis: WFL    Cognition Arousal: Alert Behavior During Therapy: WFL for tasks assessed/performed Overall Cognitive Status:  Impaired/Different from baseline Area of Impairment: Memory, Awareness, Safety/judgement                     Memory: Decreased recall of precautions, Decreased short-term memory Following Commands:  (following all on step commands; does benefit from one step commands  during new learning) Safety/Judgement: Decreased awareness of safety, Decreased awareness of deficits Awareness: Emergent   General Comments: Up to mod cues for back precautions during mobility. Educated regarding making good habits early in healing process and when experiencing pain during ADL thinking about whether she is performing within precautions and adjusting actions based on this. Pt verbalizing understanding. pt able to recall 1/3 spinal precautions at beginning of session, and min cues to recall 3/3 at end of session. Significant time spent on education this morning about expectations for pain, recovery, therapy, and on performance of both mobility and ADL within precautions.        Exercises      Shoulder Instructions       General Comments VSS    Pertinent Vitals/ Pain       Pain Assessment Pain Assessment: Faces Faces Pain Scale: Hurts little more Pain Location: back Pain Descriptors / Indicators: Discomfort Pain Intervention(s): Limited activity within patient's tolerance, Monitored during session  Home Living                                          Prior Functioning/Environment              Frequency  Min 1X/week        Progress Toward Goals  OT Goals(current goals can now be found in the care plan section)  Progress towards OT goals: Progressing toward goals  Acute Rehab OT Goals Patient Stated Goal: to stay an extra night OT Goal Formulation: With patient Time For Goal Achievement: 08/08/23 Potential to Achieve Goals: Good ADL Goals Pt Will Perform Upper Body Dressing: with modified independence Pt Will Perform Lower Body Dressing: with min assist Additional ADL Goal #1: Pt will indep recall 3/3 spinal precautions  Plan      Co-evaluation                 AM-PAC OT "6 Clicks" Daily Activity     Outcome Measure   Help from another person eating meals?: None Help from another person taking care of personal  grooming?: A Little Help from another person toileting, which includes using toliet, bedpan, or urinal?: A Little Help from another person bathing (including washing, rinsing, drying)?: A Lot Help from another person to put on and taking off regular upper body clothing?: A Little Help from another person to put on and taking off regular lower body clothing?: A Little 6 Click Score: 18    End of Session Equipment Utilized During Treatment: Rolling walker (2 wheels)  OT Visit Diagnosis: Unsteadiness on feet (R26.81);Other abnormalities of gait and mobility (R26.89);Muscle weakness (generalized) (M62.81);History of falling (Z91.81);Pain   Activity Tolerance Patient tolerated treatment well   Patient Left with call bell/phone within reach;in bed (seated EOB as on arrival)   Nurse Communication Mobility status        Time: 8657-8469 OT Time Calculation (min): 43 min  Charges: OT General Charges $OT Visit: 1 Visit OT Treatments $Self Care/Home Management : 38-52 mins  Tyler Deis, OTR/L [facility] Acute Rehabilitation Office: [phone]   Karie Kirks  [place] 07/26/2023, 8:51 AM

## 2023-07-27 LAB — GLUCOSE, CAPILLARY: Glucose-Capillary: 103 mg/dL — ABNORMAL HIGH (ref 70–99)

## 2023-07-27 NOTE — Progress Notes (Signed)
Physical Therapy Treatment Patient Details Name: Shirley Orozco MRN: [id number] DOB: [birth year] Today's Date: 07/27/2023   History of Present Illness [name] is a 72 year old who underwent decompression arthrodesis at L4-5 and L5-S1 9/24. PMH significant for arthritis, DM, HTN, PNA, PUD.    PT Comments  Pt received in recliner, anxious to prepare for DC and reporting significant other will be here soon. Primary focus of education on LSO donning/doffing, adjustment of cords for tightening brace for improved ease of use, benefits of mobility and back precautions. Pt tangential and needs min cues primarily to avoid twisting during seated/standing activity. Pt has handout for further reinforcement of back precs. Pt needing Supervision at most to perform transfers and gait and able to verbalize stair sequencing technique. Pt defers to perform longer gait trial or stairs prior to DC due to moderate to severe pain (RN notified) and eagerness to discharge, reporting no further concerns from a mobility standpoint. Pt continues to benefit from PT services to progress toward functional mobility goals, continue to recommend HHPT upon DC.    If plan is discharge home, recommend the following: A little help with walking and/or transfers;A little help with bathing/dressing/bathroom;Assistance with cooking/housework;Help with stairs or ramp for entrance;Assist for transportation   Can travel by private vehicle        Equipment Recommendations  Rolling walker (2 wheels) (youth)    Recommendations for Other Services       Precautions / Restrictions Precautions Precautions: Fall;Back Precaution Booklet Issued: Yes (comment) Precaution Comments: Reviewed handout and pt was cued for precautions during functional mobility. Required Braces or Orthoses: Spinal Brace Spinal Brace: Lumbar corset;Applied in sitting position Restrictions Weight Bearing Restrictions: No     Mobility  Bed Mobility                General bed mobility comments: Pt was received sitting reclined in chair, no brace on.    Transfers Overall transfer level: Needs assistance Equipment used: Rolling walker (2 wheels) Transfers: Sit to/from Stand Sit to Stand: Supervision           General transfer comment: VC's for hand placement on seated surface for safety. Cues to don brace prior to standing to protect her surgery site/maintain compliance with back precs.    Ambulation/Gait Ambulation/Gait assistance: Supervision Gait Distance (Feet): 50 Feet Assistive device: Rolling walker (2 wheels) (youth height) Gait Pattern/deviations: Step-through pattern, Decreased stride length, Trunk flexed, Narrow base of support Gait velocity: Decreased     General Gait Details: Slow and guarded. Appeared generally steady with RW for support, needs min cues for "no twisting" precaution when turning/standing in RW.   Stairs         General stair comments: verbal discussion on sequencing/pattern, pt reports no concerns and does not want to re-review as she performed yesterday and feels confident with performing once home   Wheelchair Mobility     Tilt Bed    Modified Rankin (Stroke Patients Only)       Balance Overall balance assessment: Needs assistance Sitting-balance support: Feet supported Sitting balance-Leahy Scale: Good     Standing balance support: Single extremity supported, During functional activity Standing balance-Leahy Scale: Fair Standing balance comment: statically at the sink                            Cognition Arousal: Alert Behavior During Therapy: [facility] for tasks assessed/performed Overall Cognitive Status: Within Functional Limits for tasks assessed Area  of Impairment: Safety/judgement, Memory                     Memory: Decreased recall of precautions Following Commands: Follows one step commands consistently Safety/Judgement: Decreased awareness of  safety Awareness: Anticipatory   General Comments: Heavy emphasis on back precautions and importance for compliance, activity pacing, benefits of frequent mobility (within tolerance) throughout the day. increased time to perform all tasks due to tangential questions/comments as pt preparing for DC.        Exercises      General Comments General comments (skin integrity, edema, etc.): No acute s/sx distress with activity or resting other than c/o surgical site pain, RN giving pain meds at end of session.      Pertinent Vitals/Pain Pain Assessment Pain Assessment: 0-10 Pain Score: 7  Pain Location: back Pain Descriptors / Indicators: Discomfort, Grimacing Pain Intervention(s): Monitored during session, Limited activity within patient's tolerance, Repositioned, Patient requesting pain meds-RN notified (reviewed use of ice/frequency)    Home Living                          Prior Function            PT Goals (current goals can now be found in the care plan section) Acute Rehab PT Goals Patient Stated Goal: Return home today. PT Goal Formulation: With patient Time For Goal Achievement: 08/01/23 Progress towards PT goals: Progressing toward goals    Frequency    Min 1X/week      PT Plan      Co-evaluation              AM-PAC PT "6 Clicks" Mobility   Outcome Measure  Help needed turning from your back to your side while in a flat bed without using bedrails?: A Little Help needed moving from lying on your back to sitting on the side of a flat bed without using bedrails?: A Little Help needed moving to and from a bed to a chair (including a wheelchair)?: A Little Help needed standing up from a chair using your arms (e.g., wheelchair or bedside chair)?: A Little Help needed to walk in hospital room?: A Little Help needed climbing 3-5 steps with a railing? : A Little 6 Click Score: 18    End of Session Equipment Utilized During Treatment: Gait  belt Activity Tolerance: Patient tolerated treatment well;Patient limited by pain Patient left: with call bell/phone within reach;in bed;with nursing/sitter in room;Other (comment) (pt sitting EOB so RN can go over DC paperwork with her) Nurse Communication: Mobility status;Patient requests pain meds PT Visit Diagnosis: Unsteadiness on feet (R26.81);Pain Pain - part of body:  (back)     Time: 0865-7846 PT Time Calculation (min) (ACUTE ONLY): 29 min  Charges:    $Gait Training: 8-22 mins $Therapeutic Activity: 8-22 mins PT General Charges $$ ACUTE PT VISIT: 1 Visit                     Yaw Escoto P., PTA Acute Rehabilitation Services Secure Chat Preferred 9a-5:30pm Office: [phone]    Dorathy Kinsman [facility] 07/27/2023, 10:35 AM

## 2023-07-27 NOTE — Progress Notes (Signed)
Occupational Therapy Treatment Patient Details Name: Shirley Orozco MRN: [id number] DOB: 09-06-1951 Today's Date: 07/27/2023   History of present illness [name] is a 72 year old who underwent decompression arthrodesis at L4-5 and L5-S1 9/24. PMH significant for arthritis, DM, HTN, PNA, PUD   OT comments  Pt. Seen for skilled OT treatment session. Pt. Able to complete toileting task, standing grooming tasks, and LB dressing demo with S/CGA and heavy cues for maintaining back precautions.  Pt. Able to verbalize precautions but does not implement consistently without cues.  Continued to reinforce throughout session for safety during mobility and ADLS.  Note d/c home likely today.  Agree that HHOT would be beneficial to continue with pts. Therapeutic progress once home.        If plan is discharge home, recommend the following:  A little help with walking and/or transfers;A lot of help with bathing/dressing/bathroom;Assistance with cooking/housework;Assist for transportation;Help with stairs or ramp for entrance   Equipment Recommendations       Recommendations for Other Services      Precautions / Restrictions Precautions Precautions: Fall;Back Precaution Comments: pt. cued for precautions during functional mobility Required Braces or Orthoses: Spinal Brace Spinal Brace: Lumbar corset;Applied in sitting position-pt. Reports she can ambulate to/from b.room without brace      Mobility Bed Mobility               General bed mobility comments: pt. seated eob upon arrival    Transfers Overall transfer level: Needs assistance Equipment used: Rolling walker (2 wheels) Transfers: Sit to/from Stand, Bed to chair/wheelchair/BSC Sit to Stand: Supervision                 Balance                                           ADL either performed or assessed with clinical judgement   ADL Overall ADL's : Needs assistance/impaired     Grooming:  Supervision/safety;Wash/dry hands;Standing Grooming Details (indicate cue type and reason): up at sink             Lower Body Dressing: Cueing for sequencing;Cueing for safety;Cueing for compensatory techniques;Cueing for back precautions;Sitting/lateral leans;Sit to/from stand;Contact guard assist Lower Body Dressing Details (indicate cue type and reason): pt. had dressed prior to my arrival. able to walk me through her tech. able to figure four to load clothing and socks and also described placing feet in the leg holes and moving the pants to where she could reach the waist band.   plans to purchase A/E from [facility] also.  while in the b.room required heavy cues to maintain back precautions while pulling pants and undergarments up.  able to reach the waist band while seated with out breaking precautions. Toilet Transfer: Contact guard assist;Ambulation;Rolling walker (2 wheels);Regular Toilet   Toileting- Clothing Manipulation and Hygiene: Supervision/safety;Sit to/from stand;Cueing for back precautions Toileting - Clothing Manipulation Details (indicate cue type and reason): pt. performing peri care in standing, required cues not to bend too forward while wiping and cleaning the areas     Functional mobility during ADLs: Contact guard assist;Rolling walker (2 wheels) General ADL Comments: heavy cues throughout session for all back precautions. able to name them with demo but did not demonstrate consistent carry over of them.    Extremity/Trunk Assessment  Vision       Perception     Praxis      Cognition Arousal: Alert Behavior During Therapy: WFL for tasks assessed/performed Overall Cognitive Status: Within Functional Limits for tasks assessed                                          Exercises      Shoulder Instructions       General Comments      Pertinent Vitals/ Pain       Pain Assessment Pain Assessment: Faces Faces Pain Scale:  Hurts a little bit Pain Location: back Pain Descriptors / Indicators: Discomfort Pain Intervention(s): Repositioned, Monitored during session, Limited activity within patient's tolerance  Home Living                                          Prior Functioning/Environment              Frequency  Min 1X/week        Progress Toward Goals  OT Goals(current goals can now be found in the care plan section)  Progress towards OT goals: Progressing toward goals     Plan      Co-evaluation                 AM-PAC OT "6 Clicks" Daily Activity     Outcome Measure   Help from another person eating meals?: None Help from another person taking care of personal grooming?: A Little Help from another person toileting, which includes using toliet, bedpan, or urinal?: A Little Help from another person bathing (including washing, rinsing, drying)?: A Lot Help from another person to put on and taking off regular upper body clothing?: A Little Help from another person to put on and taking off regular lower body clothing?: A Little 6 Click Score: 18    End of Session Equipment Utilized During Treatment: Rolling walker (2 wheels)  OT Visit Diagnosis: Unsteadiness on feet (R26.81);Other abnormalities of gait and mobility (R26.89);Muscle weakness (generalized) (M62.81);History of falling (Z91.81);Pain   Activity Tolerance Patient tolerated treatment well   Patient Left in chair;with call bell/phone within reach   Nurse Communication          Time: 1610-9604 OT Time Calculation (min): 36 min  Charges: OT General Charges $OT Visit: 1 Visit OT Treatments $Self Care/Home Management : 23-37 mins  Boneta Lucks, COTA/L Acute Rehabilitation [phone]   Alessandra Bevels Lorraine-COTA/L 07/27/2023, 8:35 AM

## 2023-07-27 NOTE — Progress Notes (Signed)
Patient alert and oriented, void, ambulate. Surgical site clean and dry no sign of infection. D/c  instructions  explain and given to the patient  all questions answered. Patient d/c home with RW per order.

## 2023-08-01 DIAGNOSIS — I10 Essential (primary) hypertension: Secondary | ICD-10-CM | POA: Diagnosis not present

## 2023-08-01 DIAGNOSIS — M722 Plantar fascial fibromatosis: Secondary | ICD-10-CM | POA: Diagnosis not present

## 2023-08-01 DIAGNOSIS — Z981 Arthrodesis status: Secondary | ICD-10-CM | POA: Diagnosis not present

## 2023-08-01 DIAGNOSIS — Z4789 Encounter for other orthopedic aftercare: Secondary | ICD-10-CM | POA: Diagnosis not present

## 2023-08-01 DIAGNOSIS — E785 Hyperlipidemia, unspecified: Secondary | ICD-10-CM | POA: Diagnosis not present

## 2023-08-01 DIAGNOSIS — M4807 Spinal stenosis, lumbosacral region: Secondary | ICD-10-CM | POA: Diagnosis not present

## 2023-08-01 DIAGNOSIS — M1990 Unspecified osteoarthritis, unspecified site: Secondary | ICD-10-CM | POA: Diagnosis not present

## 2023-08-01 DIAGNOSIS — Z7984 Long term (current) use of oral hypoglycemic drugs: Secondary | ICD-10-CM | POA: Diagnosis not present

## 2023-08-01 DIAGNOSIS — M4726 Other spondylosis with radiculopathy, lumbar region: Secondary | ICD-10-CM | POA: Diagnosis not present

## 2023-08-01 DIAGNOSIS — E119 Type 2 diabetes mellitus without complications: Secondary | ICD-10-CM | POA: Diagnosis not present

## 2023-08-01 DIAGNOSIS — M48062 Spinal stenosis, lumbar region with neurogenic claudication: Secondary | ICD-10-CM | POA: Diagnosis not present

## 2023-08-01 MED FILL — Sodium Chloride IV Soln 0.9%: INTRAVENOUS | Qty: 2000 | Status: AC

## 2023-08-01 MED FILL — Heparin Sodium (Porcine) Inj 1000 Unit/ML: INTRAMUSCULAR | Qty: 30 | Status: AC

## 2023-08-07 DIAGNOSIS — Z981 Arthrodesis status: Secondary | ICD-10-CM | POA: Diagnosis not present

## 2023-08-07 DIAGNOSIS — M48062 Spinal stenosis, lumbar region with neurogenic claudication: Secondary | ICD-10-CM | POA: Diagnosis not present

## 2023-08-07 DIAGNOSIS — Z4789 Encounter for other orthopedic aftercare: Secondary | ICD-10-CM | POA: Diagnosis not present

## 2023-08-07 DIAGNOSIS — M722 Plantar fascial fibromatosis: Secondary | ICD-10-CM | POA: Diagnosis not present

## 2023-08-07 DIAGNOSIS — M1990 Unspecified osteoarthritis, unspecified site: Secondary | ICD-10-CM | POA: Diagnosis not present

## 2023-08-07 DIAGNOSIS — I10 Essential (primary) hypertension: Secondary | ICD-10-CM | POA: Diagnosis not present

## 2023-08-07 DIAGNOSIS — E785 Hyperlipidemia, unspecified: Secondary | ICD-10-CM | POA: Diagnosis not present

## 2023-08-07 DIAGNOSIS — M4807 Spinal stenosis, lumbosacral region: Secondary | ICD-10-CM | POA: Diagnosis not present

## 2023-08-07 DIAGNOSIS — M4726 Other spondylosis with radiculopathy, lumbar region: Secondary | ICD-10-CM | POA: Diagnosis not present

## 2023-08-07 DIAGNOSIS — Z7984 Long term (current) use of oral hypoglycemic drugs: Secondary | ICD-10-CM | POA: Diagnosis not present

## 2023-08-07 DIAGNOSIS — E119 Type 2 diabetes mellitus without complications: Secondary | ICD-10-CM | POA: Diagnosis not present

## 2023-08-09 DIAGNOSIS — E119 Type 2 diabetes mellitus without complications: Secondary | ICD-10-CM | POA: Diagnosis not present

## 2023-08-09 DIAGNOSIS — E785 Hyperlipidemia, unspecified: Secondary | ICD-10-CM | POA: Diagnosis not present

## 2023-08-09 DIAGNOSIS — I10 Essential (primary) hypertension: Secondary | ICD-10-CM | POA: Diagnosis not present

## 2023-08-09 DIAGNOSIS — Z4789 Encounter for other orthopedic aftercare: Secondary | ICD-10-CM | POA: Diagnosis not present

## 2023-08-09 DIAGNOSIS — M48062 Spinal stenosis, lumbar region with neurogenic claudication: Secondary | ICD-10-CM | POA: Diagnosis not present

## 2023-08-09 DIAGNOSIS — M722 Plantar fascial fibromatosis: Secondary | ICD-10-CM | POA: Diagnosis not present

## 2023-08-09 DIAGNOSIS — Z981 Arthrodesis status: Secondary | ICD-10-CM | POA: Diagnosis not present

## 2023-08-09 DIAGNOSIS — M4807 Spinal stenosis, lumbosacral region: Secondary | ICD-10-CM | POA: Diagnosis not present

## 2023-08-09 DIAGNOSIS — M4726 Other spondylosis with radiculopathy, lumbar region: Secondary | ICD-10-CM | POA: Diagnosis not present

## 2023-08-09 DIAGNOSIS — Z7984 Long term (current) use of oral hypoglycemic drugs: Secondary | ICD-10-CM | POA: Diagnosis not present

## 2023-08-09 DIAGNOSIS — M1990 Unspecified osteoarthritis, unspecified site: Secondary | ICD-10-CM | POA: Diagnosis not present

## 2023-08-13 DIAGNOSIS — M48062 Spinal stenosis, lumbar region with neurogenic claudication: Secondary | ICD-10-CM | POA: Diagnosis not present

## 2023-08-13 DIAGNOSIS — E785 Hyperlipidemia, unspecified: Secondary | ICD-10-CM | POA: Diagnosis not present

## 2023-08-13 DIAGNOSIS — M4807 Spinal stenosis, lumbosacral region: Secondary | ICD-10-CM | POA: Diagnosis not present

## 2023-08-13 DIAGNOSIS — I10 Essential (primary) hypertension: Secondary | ICD-10-CM | POA: Diagnosis not present

## 2023-08-13 DIAGNOSIS — M1990 Unspecified osteoarthritis, unspecified site: Secondary | ICD-10-CM | POA: Diagnosis not present

## 2023-08-13 DIAGNOSIS — Z4789 Encounter for other orthopedic aftercare: Secondary | ICD-10-CM | POA: Diagnosis not present

## 2023-08-13 DIAGNOSIS — M4726 Other spondylosis with radiculopathy, lumbar region: Secondary | ICD-10-CM | POA: Diagnosis not present

## 2023-08-13 DIAGNOSIS — E119 Type 2 diabetes mellitus without complications: Secondary | ICD-10-CM | POA: Diagnosis not present

## 2023-08-13 DIAGNOSIS — M722 Plantar fascial fibromatosis: Secondary | ICD-10-CM | POA: Diagnosis not present

## 2023-08-13 DIAGNOSIS — Z7984 Long term (current) use of oral hypoglycemic drugs: Secondary | ICD-10-CM | POA: Diagnosis not present

## 2023-08-13 DIAGNOSIS — Z981 Arthrodesis status: Secondary | ICD-10-CM | POA: Diagnosis not present

## 2023-08-15 DIAGNOSIS — M5416 Radiculopathy, lumbar region: Secondary | ICD-10-CM | POA: Diagnosis not present

## 2023-08-16 DIAGNOSIS — M4807 Spinal stenosis, lumbosacral region: Secondary | ICD-10-CM | POA: Diagnosis not present

## 2023-08-16 DIAGNOSIS — Z981 Arthrodesis status: Secondary | ICD-10-CM | POA: Diagnosis not present

## 2023-08-16 DIAGNOSIS — E119 Type 2 diabetes mellitus without complications: Secondary | ICD-10-CM | POA: Diagnosis not present

## 2023-08-16 DIAGNOSIS — E785 Hyperlipidemia, unspecified: Secondary | ICD-10-CM | POA: Diagnosis not present

## 2023-08-16 DIAGNOSIS — Z4789 Encounter for other orthopedic aftercare: Secondary | ICD-10-CM | POA: Diagnosis not present

## 2023-08-16 DIAGNOSIS — M48062 Spinal stenosis, lumbar region with neurogenic claudication: Secondary | ICD-10-CM | POA: Diagnosis not present

## 2023-08-16 DIAGNOSIS — Z7984 Long term (current) use of oral hypoglycemic drugs: Secondary | ICD-10-CM | POA: Diagnosis not present

## 2023-08-16 DIAGNOSIS — M4726 Other spondylosis with radiculopathy, lumbar region: Secondary | ICD-10-CM | POA: Diagnosis not present

## 2023-08-16 DIAGNOSIS — M722 Plantar fascial fibromatosis: Secondary | ICD-10-CM | POA: Diagnosis not present

## 2023-08-16 DIAGNOSIS — M1990 Unspecified osteoarthritis, unspecified site: Secondary | ICD-10-CM | POA: Diagnosis not present

## 2023-08-16 DIAGNOSIS — I10 Essential (primary) hypertension: Secondary | ICD-10-CM | POA: Diagnosis not present

## 2023-08-21 DIAGNOSIS — M4726 Other spondylosis with radiculopathy, lumbar region: Secondary | ICD-10-CM | POA: Diagnosis not present

## 2023-08-21 DIAGNOSIS — Z7984 Long term (current) use of oral hypoglycemic drugs: Secondary | ICD-10-CM | POA: Diagnosis not present

## 2023-08-21 DIAGNOSIS — I10 Essential (primary) hypertension: Secondary | ICD-10-CM | POA: Diagnosis not present

## 2023-08-21 DIAGNOSIS — M1990 Unspecified osteoarthritis, unspecified site: Secondary | ICD-10-CM | POA: Diagnosis not present

## 2023-08-21 DIAGNOSIS — Z4789 Encounter for other orthopedic aftercare: Secondary | ICD-10-CM | POA: Diagnosis not present

## 2023-08-21 DIAGNOSIS — M4807 Spinal stenosis, lumbosacral region: Secondary | ICD-10-CM | POA: Diagnosis not present

## 2023-08-21 DIAGNOSIS — M48062 Spinal stenosis, lumbar region with neurogenic claudication: Secondary | ICD-10-CM | POA: Diagnosis not present

## 2023-08-21 DIAGNOSIS — E119 Type 2 diabetes mellitus without complications: Secondary | ICD-10-CM | POA: Diagnosis not present

## 2023-08-21 DIAGNOSIS — E785 Hyperlipidemia, unspecified: Secondary | ICD-10-CM | POA: Diagnosis not present

## 2023-08-21 DIAGNOSIS — Z981 Arthrodesis status: Secondary | ICD-10-CM | POA: Diagnosis not present

## 2023-08-21 DIAGNOSIS — M722 Plantar fascial fibromatosis: Secondary | ICD-10-CM | POA: Diagnosis not present

## 2023-08-27 DIAGNOSIS — M4807 Spinal stenosis, lumbosacral region: Secondary | ICD-10-CM | POA: Diagnosis not present

## 2023-08-27 DIAGNOSIS — E785 Hyperlipidemia, unspecified: Secondary | ICD-10-CM | POA: Diagnosis not present

## 2023-08-27 DIAGNOSIS — I10 Essential (primary) hypertension: Secondary | ICD-10-CM | POA: Diagnosis not present

## 2023-08-27 DIAGNOSIS — M4726 Other spondylosis with radiculopathy, lumbar region: Secondary | ICD-10-CM | POA: Diagnosis not present

## 2023-08-27 DIAGNOSIS — M722 Plantar fascial fibromatosis: Secondary | ICD-10-CM | POA: Diagnosis not present

## 2023-08-27 DIAGNOSIS — Z7984 Long term (current) use of oral hypoglycemic drugs: Secondary | ICD-10-CM | POA: Diagnosis not present

## 2023-08-27 DIAGNOSIS — E119 Type 2 diabetes mellitus without complications: Secondary | ICD-10-CM | POA: Diagnosis not present

## 2023-08-27 DIAGNOSIS — M48062 Spinal stenosis, lumbar region with neurogenic claudication: Secondary | ICD-10-CM | POA: Diagnosis not present

## 2023-08-27 DIAGNOSIS — Z981 Arthrodesis status: Secondary | ICD-10-CM | POA: Diagnosis not present

## 2023-08-27 DIAGNOSIS — Z4789 Encounter for other orthopedic aftercare: Secondary | ICD-10-CM | POA: Diagnosis not present

## 2023-08-27 DIAGNOSIS — M1990 Unspecified osteoarthritis, unspecified site: Secondary | ICD-10-CM | POA: Diagnosis not present

## 2023-08-30 DIAGNOSIS — E785 Hyperlipidemia, unspecified: Secondary | ICD-10-CM | POA: Diagnosis not present

## 2023-08-30 DIAGNOSIS — Z981 Arthrodesis status: Secondary | ICD-10-CM | POA: Diagnosis not present

## 2023-08-30 DIAGNOSIS — M48062 Spinal stenosis, lumbar region with neurogenic claudication: Secondary | ICD-10-CM | POA: Diagnosis not present

## 2023-08-30 DIAGNOSIS — M1990 Unspecified osteoarthritis, unspecified site: Secondary | ICD-10-CM | POA: Diagnosis not present

## 2023-08-30 DIAGNOSIS — I10 Essential (primary) hypertension: Secondary | ICD-10-CM | POA: Diagnosis not present

## 2023-08-30 DIAGNOSIS — M4807 Spinal stenosis, lumbosacral region: Secondary | ICD-10-CM | POA: Diagnosis not present

## 2023-08-30 DIAGNOSIS — Z4789 Encounter for other orthopedic aftercare: Secondary | ICD-10-CM | POA: Diagnosis not present

## 2023-08-30 DIAGNOSIS — Z7984 Long term (current) use of oral hypoglycemic drugs: Secondary | ICD-10-CM | POA: Diagnosis not present

## 2023-08-30 DIAGNOSIS — E119 Type 2 diabetes mellitus without complications: Secondary | ICD-10-CM | POA: Diagnosis not present

## 2023-08-30 DIAGNOSIS — M4726 Other spondylosis with radiculopathy, lumbar region: Secondary | ICD-10-CM | POA: Diagnosis not present

## 2023-08-30 DIAGNOSIS — M722 Plantar fascial fibromatosis: Secondary | ICD-10-CM | POA: Diagnosis not present

## 2023-10-16 DIAGNOSIS — M5416 Radiculopathy, lumbar region: Secondary | ICD-10-CM | POA: Diagnosis not present

## 2023-10-17 DIAGNOSIS — E1122 Type 2 diabetes mellitus with diabetic chronic kidney disease: Secondary | ICD-10-CM | POA: Diagnosis not present

## 2023-10-26 DIAGNOSIS — I10 Essential (primary) hypertension: Secondary | ICD-10-CM | POA: Diagnosis not present

## 2023-10-26 DIAGNOSIS — J069 Acute upper respiratory infection, unspecified: Secondary | ICD-10-CM | POA: Diagnosis not present

## 2024-02-20 DIAGNOSIS — E785 Hyperlipidemia, unspecified: Secondary | ICD-10-CM | POA: Diagnosis not present

## 2024-02-20 DIAGNOSIS — M8580 Other specified disorders of bone density and structure, unspecified site: Secondary | ICD-10-CM | POA: Diagnosis not present

## 2024-02-20 DIAGNOSIS — N183 Chronic kidney disease, stage 3 unspecified: Secondary | ICD-10-CM | POA: Diagnosis not present

## 2024-02-20 DIAGNOSIS — I10 Essential (primary) hypertension: Secondary | ICD-10-CM | POA: Diagnosis not present

## 2024-02-20 DIAGNOSIS — Z0000 Encounter for general adult medical examination without abnormal findings: Secondary | ICD-10-CM | POA: Diagnosis not present

## 2024-02-20 DIAGNOSIS — M2550 Pain in unspecified joint: Secondary | ICD-10-CM | POA: Diagnosis not present

## 2024-02-20 DIAGNOSIS — E1169 Type 2 diabetes mellitus with other specified complication: Secondary | ICD-10-CM | POA: Diagnosis not present

## 2024-02-29 DIAGNOSIS — M5416 Radiculopathy, lumbar region: Secondary | ICD-10-CM | POA: Diagnosis not present

## 2024-03-19 DIAGNOSIS — M7532 Calcific tendinitis of left shoulder: Secondary | ICD-10-CM | POA: Diagnosis not present

## 2024-04-13 DIAGNOSIS — M25512 Pain in left shoulder: Secondary | ICD-10-CM | POA: Diagnosis not present

## 2024-04-23 DIAGNOSIS — G8929 Other chronic pain: Secondary | ICD-10-CM | POA: Diagnosis not present

## 2024-04-23 DIAGNOSIS — M25512 Pain in left shoulder: Secondary | ICD-10-CM | POA: Diagnosis not present

## 2024-07-08 DIAGNOSIS — Z1231 Encounter for screening mammogram for malignant neoplasm of breast: Secondary | ICD-10-CM | POA: Diagnosis not present

## 2024-07-24 DIAGNOSIS — M19012 Primary osteoarthritis, left shoulder: Secondary | ICD-10-CM | POA: Diagnosis not present
